# Patient Record
Sex: Female | Born: 1975 | Race: White | Hispanic: No | Marital: Married | Smoking: Never smoker
Health system: Southern US, Community
[De-identification: ages and names within clinical notes are randomized; demographics above are authoritative.]

## PROBLEM LIST (undated history)

## (undated) DIAGNOSIS — G43909 Migraine, unspecified, not intractable, without status migrainosus: Secondary | ICD-10-CM

## (undated) DIAGNOSIS — F32A Depression, unspecified: Secondary | ICD-10-CM

## (undated) DIAGNOSIS — M5137 Other intervertebral disc degeneration, lumbosacral region: Secondary | ICD-10-CM

## (undated) DIAGNOSIS — M1990 Unspecified osteoarthritis, unspecified site: Secondary | ICD-10-CM

## (undated) DIAGNOSIS — T7840XA Allergy, unspecified, initial encounter: Secondary | ICD-10-CM

## (undated) DIAGNOSIS — F419 Anxiety disorder, unspecified: Secondary | ICD-10-CM

## (undated) DIAGNOSIS — M51379 Other intervertebral disc degeneration, lumbosacral region without mention of lumbar back pain or lower extremity pain: Secondary | ICD-10-CM

## (undated) DIAGNOSIS — K219 Gastro-esophageal reflux disease without esophagitis: Secondary | ICD-10-CM

## (undated) DIAGNOSIS — M5136 Other intervertebral disc degeneration, lumbar region: Secondary | ICD-10-CM

## (undated) DIAGNOSIS — M5126 Other intervertebral disc displacement, lumbar region: Secondary | ICD-10-CM

## (undated) HISTORY — DX: Allergy, unspecified, initial encounter: T78.40XA

## (undated) HISTORY — DX: Other intervertebral disc degeneration, lumbar region: M51.36

## (undated) HISTORY — DX: Depression, unspecified: F32.A

## (undated) HISTORY — DX: Other intervertebral disc degeneration, lumbosacral region: M51.37

## (undated) HISTORY — DX: Migraine, unspecified, not intractable, without status migrainosus: G43.909

## (undated) HISTORY — DX: Other intervertebral disc displacement, lumbar region: M51.26

## (undated) HISTORY — DX: Anxiety disorder, unspecified: F41.9

## (undated) HISTORY — DX: Unspecified osteoarthritis, unspecified site: M19.90

## (undated) HISTORY — DX: Other intervertebral disc degeneration, lumbosacral region without mention of lumbar back pain or lower extremity pain: M51.379

## (undated) HISTORY — DX: Gastro-esophageal reflux disease without esophagitis: K21.9

---

## 2002-05-17 HISTORY — PX: AUGMENTATION MAMMAPLASTY: SUR837

## 2004-05-17 HISTORY — PX: TUBAL LIGATION: SHX77

## 2005-04-22 ENCOUNTER — Ambulatory Visit: Payer: Self-pay | Admitting: Unknown Physician Specialty

## 2011-07-28 ENCOUNTER — Ambulatory Visit: Payer: Self-pay

## 2012-12-07 ENCOUNTER — Ambulatory Visit: Payer: Self-pay | Admitting: Ophthalmology

## 2014-09-20 ENCOUNTER — Other Ambulatory Visit: Payer: Self-pay

## 2014-09-20 DIAGNOSIS — Z1231 Encounter for screening mammogram for malignant neoplasm of breast: Secondary | ICD-10-CM

## 2014-10-09 ENCOUNTER — Ambulatory Visit: Payer: Self-pay

## 2016-04-14 ENCOUNTER — Ambulatory Visit: Payer: BLUE CROSS/BLUE SHIELD | Admitting: Physical Therapy

## 2016-08-10 NOTE — Progress Notes (Signed)
HPI:      Lori Dawson is a 41 y.o. No obstetric history on file. who LMP was Patient's last menstrual period was 07/26/2016 (exact date)., presents today for her annual examination. Her menses are regular. They occur every 5 wks with cont dosing of OCPs, they last 7-10 days now , are medium flow, and are with quarter to half dollar sized clots. She reports dysmenorrhea. She uses nsaids without symptomatic relief. She started OCPs for menorrhagia with sx relief until this yr. Her menses are heavier now, with increased cramping. She can only go about 5 wks with cont dosing OCPs now before she starts spotting. She used to be able to go 3 months. She was on ocella but pharmacy changed to different generic the past 2 months with even more increased BTB.    Sex activity: single partner, contraception - tubal ligation.  Last Pap: August 07, 2014  Results were: no abnormalities /neg HPV DNA  Hx of STDs: none  Last mammogram: August 08, 2015  Results were: normal--routine follow-up in 12 months There is no FH of breast cancer. There is no FH of ovarian cancer. The patient does do self-breast exams.  Tobacco use: The patient denies current or previous tobacco use. Alcohol use: social drinker Exercise: moderately active  She does get adequate calcium and Vitamin D in her diet.   History reviewed. No pertinent past medical history.  Past Surgical History:  Procedure Laterality Date  . AUGMENTATION MAMMAPLASTY  2004  . TUBAL LIGATION  2006    Family History  Problem Relation Age of Onset  . Cervical cancer Maternal Aunt   . Diabetes Paternal Grandmother   . Lung cancer Paternal Grandmother   . Lung cancer Paternal Grandfather      ROS:  Review of Systems  Constitutional: Negative for fever, malaise/fatigue and weight loss.  HENT: Negative for congestion, ear pain and sinus pain.   Respiratory: Negative for cough, shortness of breath and wheezing.   Cardiovascular: Negative for  chest pain, orthopnea and leg swelling.  Gastrointestinal: Negative for constipation, diarrhea, nausea and vomiting.  Genitourinary: Negative for dysuria, frequency, hematuria and urgency.       Breast ROS: negative   Musculoskeletal: Negative for back pain, joint pain and myalgias.  Skin: Negative for itching and rash.  Neurological: Negative for dizziness, tingling, focal weakness and headaches.  Endo/Heme/Allergies: Negative for environmental allergies. Does not bruise/bleed easily.  Psychiatric/Behavioral: Negative for depression and suicidal ideas. The patient is not nervous/anxious and does not have insomnia.     Objective: BP 116/78 (BP Location: Left Arm, Patient Position: Sitting, Cuff Size: Normal)   Pulse 80   Ht 5' 7"  (1.702 m)   Wt 169 lb (76.7 kg)   LMP 07/26/2016 (Exact Date)   BMI 26.47 kg/m    Physical Exam  Constitutional: She is oriented to person, place, and time. She appears well-developed and well-nourished.  Genitourinary: Vagina normal and uterus normal. No erythema or tenderness in the vagina. No vaginal discharge found. Right adnexum does not display mass and does not display tenderness. Left adnexum does not display mass and does not display tenderness. Cervix does not exhibit motion tenderness or polyp. Uterus is not enlarged or tender.  Neck: Normal range of motion. No thyromegaly present.  Cardiovascular: Normal rate, regular rhythm and normal heart sounds.   No murmur heard. Pulmonary/Chest: Effort normal and breath sounds normal. Right breast exhibits no mass, no nipple discharge, no skin change and no  tenderness. Left breast exhibits no mass, no nipple discharge, no skin change and no tenderness.  Abdominal: Soft. There is no tenderness. There is no guarding.  Musculoskeletal: Normal range of motion.  Neurological: She is alert and oriented to person, place, and time. No cranial nerve deficit.  Psychiatric: She has a normal mood and affect. Her  behavior is normal.  Vitals reviewed.   Results: GYN u/s-->EM=7.43 mm; NO FF IN CDS; RTO WNL; LTO NOT SEEN DUE TO BOWEL  Assessment/Plan: Encounter for annual routine gynecological examination  Screening for breast cancer - Pt to sched mammo - Plan: MM DIGITAL SCREENING BILATERAL  Menorrhagia with regular cycle - Neg u/s today. Check labs. Will f/u with results. If neg, will change OCPs. - Plan: TSH, Prolactin, US Transvaginal Non-OB  Encounter for surveillance of contraceptive pills              GYN counsel mammography screening, use and side effects of OCP's, adequate intake of calcium and vitamin D     F/U  Return in about 1 year (around 08/11/2017).  Alicia B. Copland, PA-C 08/11/2016 12:06 PM

## 2016-08-11 ENCOUNTER — Ambulatory Visit (INDEPENDENT_AMBULATORY_CARE_PROVIDER_SITE_OTHER): Payer: BLUE CROSS/BLUE SHIELD | Admitting: Obstetrics and Gynecology

## 2016-08-11 ENCOUNTER — Other Ambulatory Visit (INDEPENDENT_AMBULATORY_CARE_PROVIDER_SITE_OTHER): Payer: BLUE CROSS/BLUE SHIELD

## 2016-08-11 ENCOUNTER — Encounter: Payer: Self-pay | Admitting: Obstetrics and Gynecology

## 2016-08-11 VITALS — BP 116/78 | HR 80 | Ht 67.0 in | Wt 169.0 lb

## 2016-08-11 DIAGNOSIS — Z3041 Encounter for surveillance of contraceptive pills: Secondary | ICD-10-CM | POA: Diagnosis not present

## 2016-08-11 DIAGNOSIS — Z1231 Encounter for screening mammogram for malignant neoplasm of breast: Secondary | ICD-10-CM | POA: Diagnosis not present

## 2016-08-11 DIAGNOSIS — N920 Excessive and frequent menstruation with regular cycle: Secondary | ICD-10-CM | POA: Diagnosis not present

## 2016-08-11 DIAGNOSIS — Z01419 Encounter for gynecological examination (general) (routine) without abnormal findings: Secondary | ICD-10-CM

## 2016-08-11 DIAGNOSIS — Z1239 Encounter for other screening for malignant neoplasm of breast: Secondary | ICD-10-CM

## 2016-08-12 ENCOUNTER — Telehealth: Payer: Self-pay | Admitting: Obstetrics and Gynecology

## 2016-08-12 LAB — TSH: TSH: 1.15 u[IU]/mL (ref 0.450–4.500)

## 2016-08-12 LAB — PROLACTIN: Prolactin: 9.2 ng/mL (ref 4.8–23.3)

## 2016-08-12 MED ORDER — NORETHIN-ETH ESTRAD-FE BIPHAS 1 MG-10 MCG / 10 MCG PO TABS: 1.0000 | ORAL_TABLET | Freq: Every day | ORAL | 3 refills | Status: DC

## 2016-08-12 NOTE — Telephone Encounter (Signed)
Pt aware of neg labs. Will change OCPs for menorrhagia to Lo Loestrin. Rx sent. Coupon card info given to pt via phone. If doesn't work, pt to notify office so we can send her card info. Pt to continue continuous dosing of OCPs. F/u prn.

## 2016-11-01 ENCOUNTER — Ambulatory Visit
Admission: RE | Admit: 2016-11-01 | Discharge: 2016-11-01 | Disposition: A | Discharge status: Home / Self Care | Payer: BLUE CROSS/BLUE SHIELD | Source: Ambulatory Visit | Attending: Obstetrics and Gynecology | Admitting: Obstetrics and Gynecology

## 2016-11-01 ENCOUNTER — Other Ambulatory Visit: Payer: Self-pay | Admitting: Obstetrics and Gynecology

## 2016-11-01 DIAGNOSIS — Z1239 Encounter for other screening for malignant neoplasm of breast: Secondary | ICD-10-CM

## 2016-11-01 DIAGNOSIS — R928 Other abnormal and inconclusive findings on diagnostic imaging of breast: Secondary | ICD-10-CM

## 2016-11-01 DIAGNOSIS — N6489 Other specified disorders of breast: Secondary | ICD-10-CM

## 2016-11-01 DIAGNOSIS — Z1231 Encounter for screening mammogram for malignant neoplasm of breast: Secondary | ICD-10-CM | POA: Diagnosis not present

## 2016-11-05 ENCOUNTER — Inpatient Hospital Stay
Admission: RE | Admit: 2016-11-05 | Discharge: 2016-11-05 | Disposition: A | Discharge status: Home / Self Care | Payer: Self-pay | Source: Ambulatory Visit | Attending: *Deleted | Admitting: *Deleted

## 2016-11-05 ENCOUNTER — Other Ambulatory Visit: Payer: Self-pay | Admitting: *Deleted

## 2016-11-05 DIAGNOSIS — Z9289 Personal history of other medical treatment: Secondary | ICD-10-CM

## 2016-11-10 ENCOUNTER — Ambulatory Visit
Admission: RE | Admit: 2016-11-10 | Discharge: 2016-11-10 | Disposition: A | Discharge status: Home / Self Care | Payer: BLUE CROSS/BLUE SHIELD | Source: Ambulatory Visit | Attending: Obstetrics and Gynecology | Admitting: Obstetrics and Gynecology

## 2016-11-10 ENCOUNTER — Telehealth: Payer: Self-pay | Admitting: Obstetrics and Gynecology

## 2016-11-10 ENCOUNTER — Other Ambulatory Visit: Payer: Self-pay | Admitting: Obstetrics and Gynecology

## 2016-11-10 DIAGNOSIS — R928 Other abnormal and inconclusive findings on diagnostic imaging of breast: Secondary | ICD-10-CM

## 2016-11-10 DIAGNOSIS — N6489 Other specified disorders of breast: Secondary | ICD-10-CM | POA: Diagnosis not present

## 2016-11-10 DIAGNOSIS — N6324 Unspecified lump in the left breast, lower inner quadrant: Secondary | ICD-10-CM | POA: Diagnosis not present

## 2016-11-10 HISTORY — DX: Other abnormal and inconclusive findings on diagnostic imaging of breast: R92.8

## 2016-11-10 NOTE — Telephone Encounter (Signed)
Pt aware of birads-3 mammo LT breast. Pt ok to wait 6 months for f/u. Will call back if wants gen surg eval for 2nd opinion or notices any breast changes. Mammo/u/s Order in system for 12/18.

## 2016-11-11 ENCOUNTER — Telehealth: Payer: Self-pay

## 2016-11-11 DIAGNOSIS — R928 Other abnormal and inconclusive findings on diagnostic imaging of breast: Secondary | ICD-10-CM

## 2016-11-11 NOTE — Telephone Encounter (Signed)
Order placed. Nancy to do ref and f/u with pt.

## 2016-11-11 NOTE — Telephone Encounter (Signed)
Pt would like ref to surgeon for 2nd opinion on abnl mammo.  She requests someone who has experience c implants.  [phone]

## 2016-11-12 ENCOUNTER — Encounter: Payer: Self-pay | Admitting: *Deleted

## 2016-11-16 ENCOUNTER — Encounter: Payer: Self-pay | Admitting: General Surgery

## 2016-11-16 ENCOUNTER — Inpatient Hospital Stay: Payer: Self-pay

## 2016-11-16 ENCOUNTER — Ambulatory Visit (INDEPENDENT_AMBULATORY_CARE_PROVIDER_SITE_OTHER): Payer: BLUE CROSS/BLUE SHIELD | Admitting: General Surgery

## 2016-11-16 VITALS — BP 124/82 | HR 70 | Resp 12 | Ht 67.0 in | Wt 165.0 lb

## 2016-11-16 DIAGNOSIS — N6323 Unspecified lump in the left breast, lower outer quadrant: Secondary | ICD-10-CM

## 2016-11-16 NOTE — Patient Instructions (Addendum)
  The patient is aware to call back for any questions, new concerns or wishes to have a left breast biopsy.

## 2016-11-16 NOTE — Progress Notes (Signed)
Patient ID: Lori Dawson, female   DOB: [birth year], 41 y.o.   MRN: [id number]  Chief Complaint  Patient presents with  . Breast Problem    HPI Lori Dawson is a 41 y.o. female.  who presents for a breast evaluation. Referred by Ardeth Perfect PA. The most recent mammogram and left ultrasound was done on 11-10-16. She states the left breast has a random soreness feeling but no mass. Denies any breast injury or trauma. She does admit to mastitis back in 1999 but does not remember which breast. The patient reports that prior to her augmentation the left breast was a small B based bra cup, the left breast a C cup with marked distortion in the contour of the breast. These asymmetries were resolved with augmentation.  Patient does perform regular self breast checks and gets regular mammograms done.  No family history of breast cancer.   HPI  Past Medical History:  Diagnosis Date  . Allergy    seasonal    Past Surgical History:  Procedure Laterality Date  . AUGMENTATION MAMMAPLASTY  2004   in [place], right breast was larger  . TUBAL LIGATION  2006    Family History  Problem Relation Age of Onset  . Cervical cancer Maternal Aunt   . Diabetes Paternal Grandmother   . Lung cancer Paternal Grandmother   . Lung cancer Paternal Grandfather   . Breast cancer Neg Hx     Social History Social History  Substance Use Topics  . Smoking status: Never Smoker  . Smokeless tobacco: Never Used  . Alcohol use 0.6 oz/week    1 Glasses of wine per week    Allergies  Allergen Reactions  . Levofloxacin Nausea And Vomiting    Other reaction(s): Dizziness    Current Outpatient Prescriptions  Medication Sig Dispense Refill  . cetirizine (ZYRTEC) 10 MG tablet Take 10 mg by mouth daily.    Marland Kitchen ibuprofen (ADVIL,MOTRIN) 400 MG tablet Take 400 mg by mouth as needed.    . Norethindrone-Ethinyl Estradiol-Fe Biphas (LO LOESTRIN FE) 1 MG-10 MCG / 10 MCG tablet Take 1 tablet by mouth daily. 84 tablet 3    No current facility-administered medications for this visit.     Review of Systems Review of Systems  Constitutional: Negative.   Respiratory: Negative.   Cardiovascular: Negative.     Blood pressure 124/82, pulse 70, resp. rate 12, height 5' 7"  (1.702 m), weight 165 lb (74.8 kg), last menstrual period 10/18/2016.  Physical Exam Physical Exam  Constitutional: She is oriented to person, place, and time. She appears well-developed and well-nourished.  HENT:  Mouth/Throat: Oropharynx is clear and moist.  Eyes: Conjunctivae are normal. No scleral icterus.  Neck: Neck supple.  Cardiovascular: Normal rate, regular rhythm and normal heart sounds.   Pulmonary/Chest: Effort normal and breath sounds normal. Right breast exhibits no inverted nipple, no mass, no nipple discharge, no skin change and no tenderness. Left breast exhibits no inverted nipple, no mass, no nipple discharge, no skin change and no tenderness.  Lymphadenopathy:    She has no cervical adenopathy.    She has no axillary adenopathy.  Neurological: She is alert and oriented to person, place, and time.  Skin: Skin is warm and dry.  Psychiatric: Her behavior is normal.    Data Reviewed March 2013 through 11/10/2016 mammograms and ultrasounds were reviewed. Most recent study described a probably benign abnormality in the lower inner quadrant of left breast, complicated cyst with associated prominent duct. Short-term follow-up recommended.  BI-RADS-3.  On review of the 2017-2018 mammograms the focal abnormality that generated the request for ultrasound appears if anything slightly less prominent on the 2018 films.  With the findings of the ultrasound, which likely accounted for the mammographic abnormality has raised a question of a possible mass versus cyst. The patient reports questioning the radiologist if a biopsy would be completed if she did not have implants and she implied that he somewhat hesitated raising a concern  that a procedure was being deferred for fear of injury to the implants. This was the patient's interpretation of events.   On review of the films there is certainly reasonable spacing between the ultrasound abnormality and the underlying pectoralis muscle and I assured the patient that had the radiologist felt strongly about a biopsy today would've recommended the procedure be completed.  She expressed a potential desire for excision without 6 months follow-up and for that reason the area was ultrasound to determine if it would be amenable to an office procedure.  In the left breast at the 8:00 position, 8 (rather than 6) centimeters from the nipple an irregular slightly hyperechoic with hypoechoic feature lesion measuring 0.5 x 0.5 x 1.1 cm is identified and appears to correlate with the imaging studies from the [facility]. This areas situated above the pectoralis muscle and is amenable to vacuum biopsy. This is indeterminate lesion, BIRAD-3.   Assessment    Indeterminate lesion on recent mammogram/ultrasound.    Plan    We reviewed the pros and cons of early versus late biopsy. There is a component of "who will raise my babies" even though her daughters are 37 and 8. We reviewed that biopsy can resolve the ambiguity at low risk if she desires.  She was encouraged to review her options with her family and notify the office if she would like to proceed to biopsy. Otherwise, the radiologist recommendation for a follow-up in December 2018 is very appropriate.     HPI, Physical Exam, Assessment and Plan have been scribed under the direction and in the presence of Robert Bellow, MD. Karie Fetch, RN  I have completed the exam and reviewed the above documentation for accuracy and completeness.  I agree with the above.  Haematologist has been used and any errors in dictation or transcription are unintentional.  Hervey Ard, M.D., F.A.C.S.  Robert Bellow 11/17/2016, 8:19  AM

## 2016-11-17 DIAGNOSIS — N6324 Unspecified lump in the left breast, lower inner quadrant: Secondary | ICD-10-CM | POA: Insufficient documentation

## 2016-11-17 HISTORY — DX: Unspecified lump in the left breast, lower inner quadrant: N63.24

## 2016-11-18 HISTORY — PX: BREAST BIOPSY: SHX20

## 2016-11-24 ENCOUNTER — Inpatient Hospital Stay: Payer: Self-pay

## 2016-11-24 ENCOUNTER — Encounter: Payer: Self-pay | Admitting: General Surgery

## 2016-11-24 ENCOUNTER — Ambulatory Visit (INDEPENDENT_AMBULATORY_CARE_PROVIDER_SITE_OTHER): Payer: BLUE CROSS/BLUE SHIELD | Admitting: General Surgery

## 2016-11-24 VITALS — BP 118/80 | HR 76 | Resp 12 | Ht 67.0 in | Wt 165.0 lb

## 2016-11-24 DIAGNOSIS — N6324 Unspecified lump in the left breast, lower inner quadrant: Secondary | ICD-10-CM | POA: Diagnosis not present

## 2016-11-24 DIAGNOSIS — N6320 Unspecified lump in the left breast, unspecified quadrant: Secondary | ICD-10-CM

## 2016-11-24 NOTE — Patient Instructions (Signed)
CARE AFTER BREAST BIOPSY  1. Leave the dressing on that your doctor applied after the biopsy. It is waterproof. You may bathe, shower and/or swim. The dressing can be removed in 3 days, you will see small strips of tape against your skin on the incision. Do not remove these strips they will gradually fall off in about 2-3 weeks. You may use an ice pack on and off for the first 12-24 hours for comfort.  2. You may want to use a gauze,cloth or similar protection in your bra to prevent rubbing against your dressing and incision. This is not necessary, but you may feel more comfortable doing so.  3. It is recommended that you wear a bra day and night to give support to the breast. This will prevent the weight of the breast from pulling on the incision.  4. Your breast may feel hard and lumpy under the incision. Do not be alarmed. This is the underlying stitching of tissue. Softening of this tissue will occur in time.  5. You may have a follow up appointment or phone follow up in one week after your biopsy. The office phone number is [phone].  6. You will notice about a week or two after your office visit that the strips of the tape on your incision will begin to loosen. These may then be removed.  7. Report to your doctor any of the following:  * Severe pain not relieved by your pain medication  *Redness of the incision  * Drainage from the incision  *Fever greater than 101 degrees

## 2016-11-24 NOTE — Progress Notes (Signed)
Patient ID: Lori Dawson, female   DOB: [birth year], 41 y.o.   MRN: [id number]  Chief Complaint  Patient presents with  . Procedure    HPI Lori Dawson is a 41 y.o. female.  Here for a planned left breast biopsy.  HPI  Past Medical History:  Diagnosis Date  . Allergy    seasonal    Past Surgical History:  Procedure Laterality Date  . AUGMENTATION MAMMAPLASTY  2004   in [place], right breast was larger  . TUBAL LIGATION  2006    Family History  Problem Relation Age of Onset  . Cervical cancer Maternal Aunt   . Diabetes Paternal Grandmother   . Lung cancer Paternal Grandmother   . Lung cancer Paternal Grandfather   . Breast cancer Neg Hx     Social History Social History  Substance Use Topics  . Smoking status: Never Smoker  . Smokeless tobacco: Never Used  . Alcohol use 0.6 oz/week    1 Glasses of wine per week    Allergies  Allergen Reactions  . Levofloxacin Nausea And Vomiting    Other reaction(s): Dizziness    Current Outpatient Prescriptions  Medication Sig Dispense Refill  . cetirizine (ZYRTEC) 10 MG tablet Take 10 mg by mouth daily.    Marland Kitchen ibuprofen (ADVIL,MOTRIN) 400 MG tablet Take 400 mg by mouth as needed.    . Norethindrone-Ethinyl Estradiol-Fe Biphas (LO LOESTRIN FE) 1 MG-10 MCG / 10 MCG tablet Take 1 tablet by mouth daily. 84 tablet 3   No current facility-administered medications for this visit.     Review of Systems Review of Systems  Constitutional: Negative.   Respiratory: Negative.   Cardiovascular: Negative.     Blood pressure 118/80, pulse 76, resp. rate 12, height 5' 7"  (1.702 m), weight 165 lb (74.8 kg), last menstrual period 09/14/2016.  Physical Exam Physical Exam  Constitutional: She is oriented to person, place, and time. She appears well-developed and well-nourished.  Neurological: She is alert and oriented to person, place, and time.  Skin: Skin is warm and dry.  Psychiatric: Her behavior is normal.    Data Reviewed The  procedure was reviewed and the patient was amenable to proceed. Alcohol was applied to the skin followed by 10 mL of 0.5% Xylocaine with 0.25% Marcaine with 1-200,000 units of epinephrine. This was instilled under ultrasound guidance. The focal area of abnormality in the lower inner quadrant of the left breast at the 8:00 position was identified. This area was approached from a medial to lateral position after skin cleansing with ChloraPrep. Making use of a 10-gauge Encor device the area was removed with 5 core samples. Care was taken to not violate pectoralis fascia. A 10 mL hematoma develop which was aspirated and treated with an injection of 1% Xylocaine with 1-100,000 of epinephrine into the biopsy cavity. With direct pressure this did not recur. Post biopsy clip was placed. Skin defect closed with benzoin and Steri-Strips followed by Telfa and Tegaderm.  The patient tolerated the procedure well.  Assessment    Focal abnormality of the left breast, resolved on biopsy.    Plan    The patient will be contacted when pathology is available. We'll arrange for postbiopsy mammography in the near future.    HPI, Physical Exam, Assessment and Plan have been scribed under the direction and in the presence of Robert Bellow, MD. Karie Fetch, RN  I have completed the exam and reviewed the above documentation for accuracy and completeness.  I agree  with the above.  Haematologist has been used and any errors in dictation or transcription are unintentional.  Hervey Ard, M.D., F.A.C.S.  Robert Bellow 11/24/2016, 10:01 PM

## 2016-11-25 ENCOUNTER — Telehealth: Payer: Self-pay | Admitting: *Deleted

## 2016-11-25 ENCOUNTER — Other Ambulatory Visit: Payer: Self-pay | Admitting: *Deleted

## 2016-11-25 DIAGNOSIS — N6323 Unspecified lump in the left breast, lower outer quadrant: Secondary | ICD-10-CM

## 2016-11-25 NOTE — Telephone Encounter (Signed)
Patient called the office back and was notified as instructed. She verbalizes understanding.

## 2016-11-25 NOTE — Telephone Encounter (Signed)
Message left for patient to call the office.   Negative pathology report from 11-24-16.   Dr. Bary Castilla wants patient to have a 2 view post biopsy mammogram. This has been arranged for 12-02-16 at 9:40 am.

## 2016-11-30 ENCOUNTER — Ambulatory Visit (INDEPENDENT_AMBULATORY_CARE_PROVIDER_SITE_OTHER): Payer: BLUE CROSS/BLUE SHIELD | Admitting: *Deleted

## 2016-11-30 DIAGNOSIS — N6324 Unspecified lump in the left breast, lower inner quadrant: Secondary | ICD-10-CM

## 2016-11-30 NOTE — Progress Notes (Signed)
Patient ID: Lori Dawson, female   DOB: 07-13-1975, 41 y.o.   MRN: [id number]  Patient came in today for a wound check post left breast biopsy.  The wound is clean, with no signs of infection noted. Mild to moderate localized bruising noted. No underlying nodule/knot noted. She is still tender and wishes to change her post biopsy mammogram to after her cruise. Aware to use heating pad for comfort and wear a bra as needed for comfort. Mammogram rescheduled for 12-20-16.  Follow up pending.

## 2016-11-30 NOTE — Patient Instructions (Signed)
The patient is aware to call back for any questions or concerns.  

## 2016-12-22 ENCOUNTER — Ambulatory Visit
Admission: RE | Admit: 2016-12-22 | Discharge: 2016-12-22 | Disposition: A | Discharge status: Home / Self Care | Payer: BLUE CROSS/BLUE SHIELD | Source: Ambulatory Visit | Attending: General Surgery | Admitting: General Surgery

## 2016-12-22 DIAGNOSIS — N6323 Unspecified lump in the left breast, lower outer quadrant: Secondary | ICD-10-CM | POA: Insufficient documentation

## 2016-12-30 ENCOUNTER — Telehealth: Payer: Self-pay

## 2016-12-30 NOTE — Telephone Encounter (Signed)
-----   Message from Robert Bellow, MD sent at 12/30/2016  7:12 AM EDT ----- Please notify the patient that her postbiopsy mammogram showed the area of concern has been removed. She should resume annual screening mammograms. Find out if she's had any problems postbiopsy. Thank you

## 2016-12-30 NOTE — Telephone Encounter (Signed)
Notified patient as instructed, patient pleased °

## 2017-02-02 ENCOUNTER — Encounter: Payer: Self-pay | Admitting: Obstetrics and Gynecology

## 2017-02-03 ENCOUNTER — Other Ambulatory Visit: Payer: Self-pay | Admitting: Obstetrics and Gynecology

## 2017-02-03 MED ORDER — DROSPIRENONE-ETHINYL ESTRADIOL 3-0.03 MG PO TABS: 1.0000 | ORAL_TABLET | Freq: Every day | ORAL | 6 refills | Status: DC

## 2017-02-03 NOTE — Progress Notes (Signed)
Rx change due to BTB with Lo Loestrin.

## 2017-05-04 ENCOUNTER — Ambulatory Visit
Admission: RE | Admit: 2017-05-04 | Discharge: 2017-05-04 | Disposition: A | Discharge status: Home / Self Care | Payer: BLUE CROSS/BLUE SHIELD | Source: Ambulatory Visit | Attending: Obstetrics and Gynecology | Admitting: Obstetrics and Gynecology

## 2017-05-04 ENCOUNTER — Other Ambulatory Visit: Payer: Self-pay | Admitting: Obstetrics and Gynecology

## 2017-05-04 DIAGNOSIS — R928 Other abnormal and inconclusive findings on diagnostic imaging of breast: Secondary | ICD-10-CM | POA: Diagnosis present

## 2017-07-01 ENCOUNTER — Other Ambulatory Visit: Payer: Self-pay | Admitting: Obstetrics and Gynecology

## 2017-08-14 ENCOUNTER — Other Ambulatory Visit: Payer: Self-pay

## 2017-08-14 ENCOUNTER — Emergency Department: Payer: BLUE CROSS/BLUE SHIELD

## 2017-08-14 ENCOUNTER — Emergency Department
Admission: EM | Admit: 2017-08-14 | Discharge: 2017-08-14 | Disposition: A | Discharge status: Home / Self Care | Payer: BLUE CROSS/BLUE SHIELD | Attending: Emergency Medicine | Admitting: Emergency Medicine

## 2017-08-14 ENCOUNTER — Encounter: Payer: Self-pay | Admitting: Emergency Medicine

## 2017-08-14 DIAGNOSIS — Z23 Encounter for immunization: Secondary | ICD-10-CM | POA: Diagnosis not present

## 2017-08-14 DIAGNOSIS — S61217A Laceration without foreign body of left little finger without damage to nail, initial encounter: Secondary | ICD-10-CM | POA: Diagnosis not present

## 2017-08-14 DIAGNOSIS — S60512A Abrasion of left hand, initial encounter: Secondary | ICD-10-CM | POA: Diagnosis not present

## 2017-08-14 DIAGNOSIS — Y929 Unspecified place or not applicable: Secondary | ICD-10-CM | POA: Diagnosis not present

## 2017-08-14 DIAGNOSIS — Y939 Activity, unspecified: Secondary | ICD-10-CM | POA: Insufficient documentation

## 2017-08-14 DIAGNOSIS — Y999 Unspecified external cause status: Secondary | ICD-10-CM | POA: Insufficient documentation

## 2017-08-14 DIAGNOSIS — S6992XA Unspecified injury of left wrist, hand and finger(s), initial encounter: Secondary | ICD-10-CM | POA: Diagnosis present

## 2017-08-14 DIAGNOSIS — W540XXA Bitten by dog, initial encounter: Secondary | ICD-10-CM | POA: Insufficient documentation

## 2017-08-14 MED ORDER — AMOXICILLIN-POT CLAVULANATE 875-125 MG PO TABS: 1.0000 | ORAL_TABLET | Freq: Two times a day (BID) | ORAL | 0 refills | Status: AC

## 2017-08-14 MED ORDER — AMOXICILLIN-POT CLAVULANATE 875-125 MG PO TABS
1.0000 | ORAL_TABLET | Freq: Once | ORAL | Status: AC
  Administered 2017-08-14: 1 via ORAL
  Filled 2017-08-14: qty 1

## 2017-08-14 MED ORDER — LIDOCAINE-EPINEPHRINE-TETRACAINE (LET) SOLUTION
3.0000 mL | Freq: Once | NASAL | Status: AC
  Administered 2017-08-14: 3 mL via TOPICAL

## 2017-08-14 MED ORDER — FLUCONAZOLE 200 MG PO TABS: 200.0000 mg | ORAL_TABLET | Freq: Once | ORAL | 0 refills | Status: AC

## 2017-08-14 MED ORDER — ACETAMINOPHEN 500 MG PO TABS
1000.0000 mg | ORAL_TABLET | Freq: Once | ORAL | Status: AC
  Administered 2017-08-14: 1000 mg via ORAL
  Filled 2017-08-14: qty 2

## 2017-08-14 MED ORDER — LIDOCAINE-EPINEPHRINE-TETRACAINE (LET) SOLUTION
NASAL | Status: AC
  Administered 2017-08-14: 3 mL via TOPICAL
  Filled 2017-08-14: qty 3

## 2017-08-14 MED ORDER — TETANUS-DIPHTH-ACELL PERTUSSIS 5-2.5-18.5 LF-MCG/0.5 IM SUSP
0.5000 mL | Freq: Once | INTRAMUSCULAR | Status: AC
  Administered 2017-08-14: 0.5 mL via INTRAMUSCULAR
  Filled 2017-08-14: qty 0.5

## 2017-08-14 NOTE — ED Triage Notes (Signed)
Pt was brought from [facility] acute care for further exam of left hand, pt's dog bit her hand as it was having a seizure. kcac concerned that there maybe tendon involvement, hand cleaned and wrapped

## 2017-08-14 NOTE — ED Triage Notes (Signed)
Pt to ED from [facility] walk in. Pt was bitten by her dog on the left hand. Hand currently bandaged. Bleeding is controlled at this time. Dog was having a seizure and she was trying to comfort the dog and when he woke up, he bit her. Dog is up to date on all vaccines. [facility] reported the bite.

## 2017-08-14 NOTE — ED Notes (Signed)
See triage note  Presents s/p dog bite to left hand  Has 2 puncture type wounds to left hand near 4th and 5th fingers

## 2017-08-14 NOTE — ED Provider Notes (Signed)
[facility] DEPART[facility] Provider Note   CSN: [id number] Arrival date & time: 08/14/17  1524     History   Chief Complaint Chief Complaint  Patient presents with  . Animal Bite    HPI Lori Dawson is a 42 y.o. female.  Presents to the emergency department for evaluation of animal bite to the left hand.  Patient states her dog was having a seizure and she was trying to comfort the dog when the dog bit her left hand.  Patient's tetanus is not up-to-date.  Patient states her dog's vaccinations are up-to-date.  Patient suffered abrasions lacerations to the left hand.  Her pain is mild 3 out of 10.  She has not had medications for pain.  She was seen at the walk-in clinic and sent over for further evaluation.  She denies any other injury to her body.  HPI  Past Medical History:  Diagnosis Date  . Allergy    seasonal    Patient Active Problem List   Diagnosis Date Noted  . Mass of lower inner quadrant of left breast 11/17/2016  . Abnormal mammogram 11/10/2016    Past Surgical History:  Procedure Laterality Date  . AUGMENTATION MAMMAPLASTY  2004   in [place], right breast was larger  . BREAST BIOPSY Left 11/18/2016   core bx Byrnett  . TUBAL LIGATION  2006     OB History    Gravida  2   Para      Term      Preterm      AB      Living        SAB      TAB      Ectopic      Multiple      Live Births  2        Obstetric Comments  1st Menstrual Cycle:  10 1st Pregnancy:  21          Home Medications    Prior to Admission medications   Medication Sig Start Date End Date Taking? Authorizing Provider  cetirizine (ZYRTEC) 10 MG tablet Take 10 mg by mouth daily.    [provider]  drospirenone-ethinyl estradiol (YASMIN,ZARAH,SYEDA) 3-0.03 MG tablet TAKE 1 TABLET BY MOUTH DAILY. CONTINUOUS DOSING 5/99/35   Copland, Alicia B, PA-C  ibuprofen (ADVIL,MOTRIN) 400 MG tablet Take 400 mg by mouth as needed.    [provider]    Family History Family History  Problem Relation Age of Onset  . Cervical cancer Maternal Aunt   . Diabetes Paternal Grandmother   . Lung cancer Paternal Grandmother   . Lung cancer Paternal Grandfather   . Breast cancer Neg Hx     Social History Social History   Tobacco Use  . Smoking status: Never Smoker  . Smokeless tobacco: Never Used  Substance Use Topics  . Alcohol use: Yes    Alcohol/week: 0.6 oz    Types: 1 Glasses of wine per week  . Drug use: No     Allergies   Levofloxacin   Review of Systems Review of Systems  Constitutional: Negative for fever.  Musculoskeletal: Positive for arthralgias. Negative for joint swelling, myalgias and neck pain.  Skin: Positive for wound. Negative for rash.  Neurological: Negative for numbness.     Physical Exam Updated Vital Signs BP 129/87 (BP Location: Right Arm)   Pulse 84   Temp 99.6 F (37.6 C) (Oral)   Resp 18   Ht 5'  7" (1.702 m)   Wt 72.6 kg (160 lb)   LMP 06/18/2017   SpO2 99%   BMI 25.06 kg/m   Physical Exam  Constitutional: She is oriented to person, place, and time. She appears well-developed and well-nourished.  HENT:  Head: Normocephalic and atraumatic.  Eyes: Conjunctivae are normal.  Cardiovascular: Normal rate.  Pulmonary/Chest: Effort normal. No respiratory distress.  Musculoskeletal: Normal range of motion. She exhibits tenderness.  Examination of the left hand shows patient has small abrasion on the dorsal aspect of the MCP, fourth digit.  No laceration or puncture wound noted.  Patient has full active extension and flexion of all 5 digits.  There is a small laceration on the base of the volar aspect of the fifth digit at the base of the proximal phalanx extending into the web space of the fourth and fifth digits of the left hand.  There is no visible palpable foreign body.  No active bleeding.  Sensation is intact distally.  No sign of a nail injury.  Neurological: She is  alert and oriented to person, place, and time.  Skin: No rash noted. No erythema.  Psychiatric: She has a normal mood and affect. Judgment and thought content normal.     ED Treatments / Results  Labs (all labs ordered are listed, but only abnormal results are displayed) Labs Reviewed - No data to display  EKG None  Radiology No results found.  Procedures .Marland KitchenLaceration Repair Date/Time: 08/14/2017 4:33 PM Performed by: Duanne Guess, PA-C Authorized by: Duanne Guess, PA-C   Consent:    Consent obtained:  Verbal   Consent given by:  Patient   Risks discussed:  Infection, pain, retained foreign body and nerve damage   Alternatives discussed:  No treatment Anesthesia (see MAR for exact dosages):    Anesthesia method:  Topical application   Topical anesthetic:  LET Laceration details:    Location:  Finger   Finger location:  L small finger   Length (cm):  1   Depth (mm):  2 Repair type:    Repair type:  Simple Pre-procedure details:    Preparation:  Patient was prepped and draped in usual sterile fashion Exploration:    Wound exploration: wound explored through full range of motion and entire depth of wound probed and visualized   Treatment:    Area cleansed with:  Betadine and saline   Irrigation method:  Pressure wash and syringe Skin repair:    Repair method:  Sutures   Suture size:  5-0   Suture technique:  Simple interrupted   Number of sutures:  2 Approximation:    Approximation:  Close Post-procedure details:    Dressing:  Open (no dressing)   Patient tolerance of procedure:  Tolerated well, no immediate complications   (including critical care time)  Medications Ordered in ED Medications  Tdap (BOOSTRIX) injection 0.5 mL (0.5 mLs Intramuscular Given 08/14/17 1618)  acetaminophen (TYLENOL) tablet 1,000 mg (1,000 mg Oral Given 08/14/17 1618)  lidocaine-EPINEPHrine-tetracaine (LET) solution (3 mLs Topical Given 08/14/17 1618)  amoxicillin-clavulanate  (AUGMENTIN) 875-125 MG per tablet 1 tablet (1 tablet Oral Given 08/14/17 1625)     Initial Impression / Assessment and Plan / ED Course  I have reviewed the triage vital signs and the nursing notes.  Pertinent labs & imaging results that were available during my care of the patient were reviewed by me and considered in my medical decision making (see chart for details).     42 year old female  with dog bite to the left hand.  She suffered a small abrasion on the dorsal aspect of the hand as well as a puncture wound of the base of the left fifth digit.  Patient has full active flexion and extension of the fourth and fifth digits.  No tendon deficits noted.  Slight sensation discrepancy along the radial aspect of the fifth digit.  Laceration site repaired with #2 5-0 nylon sutures.  Compressive dressing applied.  Patient is placed on prophylactic antibiotic.  Tetanus is updated today in the ED.  X-rays ordered and reviewed by me today show no evidence of foreign body or acute bony normality.  She will follow-up in 7-10 days for suture removal.  Final Clinical Impressions(s) / ED Diagnoses   Final diagnoses:  Dog bite, initial encounter  Laceration of left little finger without foreign body without damage to nail, initial encounter  Abrasion of left hand, initial encounter    ED Discharge Orders    None       Renata Caprice 08/14/17 1725    Hinda Kehr, MD 08/17/17 1221

## 2017-08-14 NOTE — Discharge Instructions (Signed)
Please continue with compressive dressing for 24 hours.  Please alternate Tylenol 1000 mg with ibuprofen 800 mg every 6 hours as needed for pain.  Try to keep the left hand elevated as much as he can over the next 2 days to help with swelling.  After 24 hours, you may remove compressive dressing, begin to shower.  Do not submerge hand under water.  You may keep laceration, abrasions covered with Band-Aid during the day.  Take antibiotics as prescribed.  If any increasing pain swelling or redness he may return to the walk-in clinic for emergency department.  Please follow-up with walk-in clinic in 7-10 days for suture removal.

## 2017-08-15 ENCOUNTER — Ambulatory Visit (INDEPENDENT_AMBULATORY_CARE_PROVIDER_SITE_OTHER): Payer: BLUE CROSS/BLUE SHIELD | Admitting: Obstetrics and Gynecology

## 2017-08-15 ENCOUNTER — Encounter: Payer: Self-pay | Admitting: Obstetrics and Gynecology

## 2017-08-15 VITALS — BP 134/92 | HR 75 | Ht 67.0 in | Wt 161.0 lb

## 2017-08-15 DIAGNOSIS — Z1151 Encounter for screening for human papillomavirus (HPV): Secondary | ICD-10-CM | POA: Diagnosis not present

## 2017-08-15 DIAGNOSIS — Z8249 Family history of ischemic heart disease and other diseases of the circulatory system: Secondary | ICD-10-CM

## 2017-08-15 DIAGNOSIS — R030 Elevated blood-pressure reading, without diagnosis of hypertension: Secondary | ICD-10-CM

## 2017-08-15 DIAGNOSIS — Z01419 Encounter for gynecological examination (general) (routine) without abnormal findings: Secondary | ICD-10-CM

## 2017-08-15 DIAGNOSIS — Z0000 Encounter for general adult medical examination without abnormal findings: Secondary | ICD-10-CM | POA: Diagnosis not present

## 2017-08-15 DIAGNOSIS — Z13220 Encounter for screening for lipoid disorders: Secondary | ICD-10-CM | POA: Diagnosis not present

## 2017-08-15 DIAGNOSIS — Z1231 Encounter for screening mammogram for malignant neoplasm of breast: Secondary | ICD-10-CM | POA: Diagnosis not present

## 2017-08-15 DIAGNOSIS — Z124 Encounter for screening for malignant neoplasm of cervix: Secondary | ICD-10-CM

## 2017-08-15 DIAGNOSIS — Z1329 Encounter for screening for other suspected endocrine disorder: Secondary | ICD-10-CM | POA: Diagnosis not present

## 2017-08-15 DIAGNOSIS — Z1239 Encounter for other screening for malignant neoplasm of breast: Secondary | ICD-10-CM

## 2017-08-15 DIAGNOSIS — Z3041 Encounter for surveillance of contraceptive pills: Secondary | ICD-10-CM | POA: Diagnosis not present

## 2017-08-15 DIAGNOSIS — R079 Chest pain, unspecified: Secondary | ICD-10-CM

## 2017-08-15 DIAGNOSIS — N938 Other specified abnormal uterine and vaginal bleeding: Secondary | ICD-10-CM

## 2017-08-15 NOTE — Patient Instructions (Signed)
I value your feedback and entrusting us with your care. If you get a [facility] patient survey, I would appreciate you taking the time to let us know about your experience today. Thank you! 

## 2017-08-15 NOTE — Progress Notes (Signed)
HPI:      Lori Dawson is a 42 y.o. G2P0 who LMP was Patient's last menstrual period was 06/18/2017 (exact date)., presents today for her annual examination. Her menses are irregular.  Menses last about 7 days on placebo pills but then pt has bleeding for 3-7 days after sex. She has dysmen with bleeding episodes, usually improved with NSAIDs. She used to be able to have menses Q3 months with cont dosing of yasmin. Last yr she could only go 5 wks without bleeding, so OCPs changed to Lo Loestrin. Pt states bleeding more frequent so we switched back to Yasmin for Q5 wk periods. But, bleeding more frequent now and can't go a month without BTB. Pt has bled most of March. No recent labs.  Sex activity: single partner, contraception - tubal ligation.  Last Pap: August 07, 2014  Results were: no abnormalities /neg HPV DNA  Hx of STDs: none  Last mammogram: 11/10/16  Results were: Cat 3 LT breast. Pt had repeat mammo and u/s 12/18 that was cat 2. Due for yearly mammo 6/19. There is no FH of breast cancer. There is no FH of ovarian cancer. The patient does do self-breast exams.  Tobacco use: The patient denies current or previous tobacco use. Alcohol use: social drinker Exercise: moderately active  She does get adequate calcium and Vitamin D in her diet.  Her brother died suddenly from MI last wk at age 39 without any sx. Pt is concerned because had episode of hot flashes, nausea, near syncope and chest pain 12/18. No sx since. No recent lipid labs. Pt's BP elevated during cortisone inj in back recently, and again today, but pt just lost brother.   Past Medical History:  Diagnosis Date  . Allergy    seasonal  . 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 Herniated lumbar intervertebral disc     Past Surgical History:  Procedure Laterality Date  . AUGMENTATION MAMMAPLASTY  2004   in [place], right breast was larger  . BREAST BIOPSY Left 11/18/2016   core bx Byrnett  . TUBAL  LIGATION  2006    Family History  Problem Relation Age of Onset  . Cervical cancer Maternal Aunt   . Diabetes Paternal Grandmother   . Lung cancer Paternal Grandmother   . Lung cancer Paternal Grandfather   . Heart attack Brother 63  . Breast cancer Neg Hx     Social History   Socioeconomic History  . Marital status: Married    Spouse name: Not on file  . Number of children: Not on file  . Years of education: Not on file  . Highest education level: Not on file  Occupational History  . Not on file  Social Needs  . Financial resource strain: Not on file  . Food insecurity:    Worry: Not on file    Inability: Not on file  . Transportation needs:    Medical: Not on file    Non-medical: Not on file  Tobacco Use  . Smoking status: Never Smoker  . Smokeless tobacco: Never Used  Substance and Sexual Activity  . Alcohol use: Yes    Alcohol/week: 0.6 oz    Types: 1 Glasses of wine per week  . Drug use: No  . Sexual activity: Yes    Birth control/protection: Surgical    Comment: Tubal ligation   Lifestyle  . Physical activity:    Days per week: Not on file  Minutes per session: Not on file  . Stress: Not on file  Relationships  . Social connections:    Talks on phone: Not on file    Gets together: Not on file    Attends religious service: Not on file    Active member of club or organization: Not on file    Attends meetings of clubs or organizations: Not on file    Relationship status: Not on file  . Intimate partner violence:    Fear of current or ex partner: Not on file    Emotionally abused: Not on file    Physically abused: Not on file    Forced sexual activity: Not on file  Other Topics Concern  . Not on file  Social History Narrative  . Not on file    Current Outpatient Medications on File Prior to Visit  Medication Sig Dispense Refill  . amoxicillin-clavulanate (AUGMENTIN) 875-125 MG tablet Take 1 tablet by mouth every 12 (twelve) hours for 7 days. 14  tablet 0  . Azelastine-Fluticasone 137-50 MCG/ACT SUSP Place into the nose.    . cetirizine (ZYRTEC) 10 MG tablet Take 10 mg by mouth daily.    . cyclobenzaprine (FLEXERIL) 10 MG tablet Take 10 mg by mouth 3 (three) times daily as needed for muscle spasms.    . drospirenone-ethinyl estradiol (YASMIN,ZARAH,SYEDA) 3-0.03 MG tablet TAKE 1 TABLET BY MOUTH DAILY. CONTINUOUS DOSING 28 tablet 1  . fluconazole (DIFLUCAN) 200 MG tablet   0  . ibuprofen (ADVIL,MOTRIN) 400 MG tablet Take 400 mg by mouth as needed.    . montelukast (SINGULAIR) 10 MG tablet     . Olopatadine HCl 0.2 % SOLN      No current facility-administered medications on file prior to visit.       ROS:  Review of Systems  Constitutional: Negative for fatigue, fever and unexpected weight change.  Respiratory: Negative for cough, shortness of breath and wheezing.   Cardiovascular: Negative for chest pain, palpitations and leg swelling.  Gastrointestinal: Negative for blood in stool, constipation, diarrhea, nausea and vomiting.  Endocrine: Negative for cold intolerance, heat intolerance and polyuria.  Genitourinary: Positive for dyspareunia, menstrual problem and vaginal discharge. Negative for dysuria, flank pain, frequency, genital sores, hematuria, pelvic pain, urgency, vaginal bleeding and vaginal pain.  Musculoskeletal: Negative for back pain, joint swelling and myalgias.  Skin: Negative for rash.  Neurological: Positive for headaches. Negative for dizziness, syncope, light-headedness and numbness.  Hematological: Negative for adenopathy.  Psychiatric/Behavioral: Positive for dysphoric mood. Negative for agitation, confusion, sleep disturbance and suicidal ideas. The patient is not nervous/anxious.      Objective: BP (!) 134/92   Pulse 75   Ht 5' 7"  (1.702 m)   Wt 161 lb (73 kg)   LMP 06/18/2017 (Exact Date) Comment: intermittent bleeding  BMI 25.22 kg/m    Physical Exam  Constitutional: She is oriented to person,  place, and time. She appears well-developed and well-nourished.  Genitourinary: Vagina normal and uterus normal. There is no rash or tenderness on the right labia. There is no rash or tenderness on the left labia. No erythema or tenderness in the vagina. No vaginal discharge found. Right adnexum does not display mass and does not display tenderness. Left adnexum does not display mass and does not display tenderness. Cervix does not exhibit motion tenderness or polyp. Uterus is not enlarged or tender.  Neck: Normal range of motion. No thyromegaly present.  Cardiovascular: Normal rate, regular rhythm and normal heart sounds.  No  murmur heard. Pulmonary/Chest: Effort normal and breath sounds normal. Right breast exhibits no mass, no nipple discharge, no skin change and no tenderness. Left breast exhibits no mass, no nipple discharge, no skin change and no tenderness.  Abdominal: Soft. There is no tenderness. There is no guarding.  Musculoskeletal: Normal range of motion.  Neurological: She is alert and oriented to person, place, and time. No cranial nerve deficit.  Psychiatric: She has a normal mood and affect. Her behavior is normal.  Vitals reviewed.   Assessment/Plan: Encounter for annual routine gynecological examination  Cervical cancer screening - Plan: IGP, Aptima HPV  Screening for HPV (human papillomavirus) - Plan: IGP, Aptima HPV  Screening for breast cancer - Pt to sched mammo 6/19 - Plan: MM DIGITAL SCREENING BILATERAL  DUB (dysfunctional uterine bleeding) - Check labs and GYN u/s. Will f/u with results and plan. - Plan: US PELVIS TRANSVANGINAL NON-OB (TV ONLY)  Encounter for surveillance of contraceptive pills - Having DUB with yasmin. Eval further with u/s, labs. If neg, may need brand change vs other option.   Blood tests for routine general physical examination - Plan: Comprehensive metabolic panel, Lipid panel, TSH + free T4, Prolactin  Screening cholesterol level - Plan:  Lipid panel  Thyroid disorder screening - Plan: TSH + free T4  Elevated blood pressure reading in office without diagnosis of hypertension - Pt under grief stress. Rechk BPs at home when quiet.   Chest pain in adult - Episode with near syncope 12/18. Brother with early MI. Refer to cardio for further eval. Check lipids today. Pt to check BPs at home due to grief stress today. - Plan: Ambulatory referral to Cardiology  Family history of myocardial infarction in first degree female relative - Plan: Ambulatory referral to Cardiology        GYN counsel breast self exam, mammography screening, adequate intake of calcium and vitamin D, diet and exercise     F/U  Return in about 1 day (around 08/16/2017) for GYN u/s for DUB, fasting labs--[facility] to call pt.  Neev Mcmains B. Talmadge Ganas, PA-C 08/15/2017 3:02 PM

## 2017-08-16 ENCOUNTER — Other Ambulatory Visit: Payer: BLUE CROSS/BLUE SHIELD

## 2017-08-17 LAB — IGP, APTIMA HPV
HPV Aptima: NEGATIVE
PAP Smear Comment: 0

## 2017-08-17 LAB — COMPREHENSIVE METABOLIC PANEL
ALT: 20 IU/L (ref 0–32)
AST: 16 IU/L (ref 0–40)
Albumin/Globulin Ratio: 1.4 (ref 1.2–2.2)
Albumin: 4.2 g/dL (ref 3.5–5.5)
Alkaline Phosphatase: 45 IU/L (ref 39–117)
BUN/Creatinine Ratio: 16 (ref 9–23)
BUN: 11 mg/dL (ref 6–24)
Bilirubin Total: 0.6 mg/dL (ref 0.0–1.2)
CO2: 21 mmol/L (ref 20–29)
Calcium: 9 mg/dL (ref 8.7–10.2)
Chloride: 103 mmol/L (ref 96–106)
Creatinine, Ser: 0.68 mg/dL (ref 0.57–1.00)
GFR calc Af Amer: 124 mL/min/{1.73_m2} (ref >59)
GFR calc non Af Amer: 107 mL/min/{1.73_m2} (ref >59)
Globulin, Total: 2.9 g/dL (ref 1.5–4.5)
Glucose: 82 mg/dL (ref 65–99)
Potassium: 4.1 mmol/L (ref 3.5–5.2)
Sodium: 140 mmol/L (ref 134–144)
Total Protein: 7.1 g/dL (ref 6.0–8.5)

## 2017-08-17 LAB — LIPID PANEL
Chol/HDL Ratio: 3.3 ratio (ref 0.0–4.4)
Cholesterol, Total: 164 mg/dL (ref 100–199)
HDL: 50 mg/dL (ref >39)
LDL Calculated: 90 mg/dL (ref 0–99)
Triglycerides: 118 mg/dL (ref 0–149)
VLDL Cholesterol Cal: 24 mg/dL (ref 5–40)

## 2017-08-17 LAB — PROLACTIN: Prolactin: 6.8 ng/mL (ref 4.8–23.3)

## 2017-08-17 LAB — TSH+FREE T4
Free T4: 1.38 ng/dL (ref 0.82–1.77)
TSH: 0.612 u[IU]/mL (ref 0.450–4.500)

## 2017-08-18 ENCOUNTER — Ambulatory Visit (INDEPENDENT_AMBULATORY_CARE_PROVIDER_SITE_OTHER): Payer: BLUE CROSS/BLUE SHIELD

## 2017-08-18 ENCOUNTER — Telehealth: Payer: Self-pay | Admitting: Obstetrics and Gynecology

## 2017-08-18 DIAGNOSIS — N938 Other specified abnormal uterine and vaginal bleeding: Secondary | ICD-10-CM

## 2017-08-18 NOTE — Telephone Encounter (Signed)
    ULTRASOUND REPORT  Location: [facility]  Date of Service: 08/18/2017    Indications:DUB Findings:  The uterus is anteverted and measures 8.24 x 4.50 x 4.38cm. Echo texture is heterogenous without evidence of focal masses.  The Endometrium measures 2.87 mm.  Right Ovary measures 2.39 x 2.67 x 1.06 cm. It is normal in appearance. Left Ovary measures 1.61 x 1.64 x 1.35 cm. It is normal in appearance. Survey of the adnexa demonstrates no adnexal masses. There is no free fluid in the cul de sac.  Impression: 1. Heterogeneous uterus, otherwise normal.  Recommendations: 1.Clinical correlation with the patient's History and Physical Exam.   Edwena Bunde, RDMS, RVT

## 2017-08-22 ENCOUNTER — Other Ambulatory Visit: Payer: Self-pay | Admitting: Obstetrics and Gynecology

## 2017-08-22 DIAGNOSIS — Z3041 Encounter for surveillance of contraceptive pills: Secondary | ICD-10-CM

## 2017-08-22 MED ORDER — LEVONORGESTREL-ETHINYL ESTRAD 0.1-20 MG-MCG PO TABS: 1.0000 | ORAL_TABLET | Freq: Every day | ORAL | 3 refills | Status: DC

## 2017-08-22 NOTE — Telephone Encounter (Signed)
Pt aware of neg u/s and DUB labs. Wants to cont OCPs. Discussed nuvaring and IUD, too. Failed cont dosing with Lo Loestrin 2018 and yasmin no longer working for cont dosing.  Will try aviane. Rx eRxd. F/u prn.

## 2017-09-22 ENCOUNTER — Ambulatory Visit: Payer: BLUE CROSS/BLUE SHIELD | Admitting: Internal Medicine

## 2017-09-22 ENCOUNTER — Encounter: Payer: Self-pay | Admitting: Internal Medicine

## 2017-09-22 VITALS — BP 152/98 | HR 70 | Ht 67.0 in | Wt 162.2 lb

## 2017-09-22 DIAGNOSIS — R0789 Other chest pain: Secondary | ICD-10-CM | POA: Diagnosis not present

## 2017-09-22 DIAGNOSIS — R030 Elevated blood-pressure reading, without diagnosis of hypertension: Secondary | ICD-10-CM

## 2017-09-22 DIAGNOSIS — R002 Palpitations: Secondary | ICD-10-CM

## 2017-09-22 HISTORY — DX: Palpitations: R00.2

## 2017-09-22 HISTORY — DX: Other chest pain: R07.89

## 2017-09-22 NOTE — Patient Instructions (Addendum)
Testing/Procedures: Your physician has requested that you have an exercise tolerance test. For further information please visit [email or web address]. Please also follow instruction sheet, as given.  Do not drink or eat foods with caffeine for 24 hours before the test. (Chocolate, coffee, tea, or energy drinks)  If you use an inhaler, bring it with you to the test.  Do not smoke for 4 hours before the test.  Wear comfortable shoes and clothing.  [facility] [address] (3rd floor) [place] $150.00 Call [phone] to schedule   Follow-Up: Your physician recommends that you schedule a follow-up appointment in: 3 months with Dr. Saunders Revel.  It was a pleasure seeing you today here in the office. Please do not hesitate to give [place] a call back if you have any further questions. Rains, BSN     DASH Eating Plan DASH stands for "Dietary Approaches to Stop Hypertension." The DASH eating plan is a healthy eating plan that has been shown to reduce high blood pressure (hypertension). It may also reduce your risk for type 2 diabetes, heart disease, and stroke. The DASH eating plan may also help with weight loss. What are tips for following this plan? General guidelines  Avoid eating more than 2,300 mg (milligrams) of salt (sodium) a day. If you have hypertension, you may need to reduce your sodium intake to 1,500 mg a day.  Limit alcohol intake to no more than 1 drink a day for nonpregnant women and 2 drinks a day for men. One drink equals 12 oz of beer, 5 oz of wine, or 1 oz of hard liquor.  Work with your health care provider to maintain a healthy body weight or to lose weight. Ask what an ideal weight is for you.  Get at least 30 minutes of exercise that causes your heart to beat faster (aerobic exercise) most days of the week. Activities may include walking, swimming, or biking.  Work with your health care provider or diet and nutrition  specialist (dietitian) to adjust your eating plan to your individual calorie needs. Reading food labels  Check food labels for the amount of sodium per serving. Choose foods with less than 5 percent of the Daily Value of sodium. Generally, foods with less than 300 mg of sodium per serving fit into this eating plan.  To find whole grains, look for the word "whole" as the first word in the ingredient list. Shopping  Buy products labeled as "low-sodium" or "no salt added."  Buy fresh foods. Avoid canned foods and premade or frozen meals. Cooking  Avoid adding salt when cooking. Use salt-free seasonings or herbs instead of table salt or sea salt. Check with your health care provider or pharmacist before using salt substitutes.  Do not fry foods. Cook foods using healthy methods such as baking, boiling, grilling, and broiling instead.  Cook with heart-healthy oils, such as olive, canola, soybean, or sunflower oil. Meal planning   Eat a balanced diet that includes: ? 5 or more servings of fruits and vegetables each day. At each meal, try to fill half of your plate with fruits and vegetables. ? Up to 6-8 servings of whole grains each day. ? Less than 6 oz of lean meat, poultry, or fish each day. A 3-oz serving of meat is about the same size as a deck of cards. One egg equals 1 oz. ? 2 servings of low-fat dairy each day. ? A serving of nuts, seeds, or beans  5 times each week. ? Heart-healthy fats. Healthy fats called Omega-3 fatty acids are found in foods such as flaxseeds and coldwater fish, like sardines, salmon, and mackerel.  Limit how much you eat of the following: ? Canned or prepackaged foods. ? Food that is high in trans fat, such as fried foods. ? Food that is high in saturated fat, such as fatty meat. ? Sweets, desserts, sugary drinks, and other foods with added sugar. ? Full-fat dairy products.  Do not salt foods before eating.  Try to eat at least 2 vegetarian meals each  week.  Eat more home-cooked food and less restaurant, buffet, and fast food.  When eating at a restaurant, ask that your food be prepared with less salt or no salt, if possible. What foods are recommended? The items listed may not be a complete list. Talk with your dietitian about what dietary choices are best for you. Grains Whole-grain or whole-wheat bread. Whole-grain or whole-wheat pasta. Brown rice. [place]. Bulgur. Whole-grain and low-sodium cereals. Pita bread. Low-fat, low-sodium crackers. Whole-wheat flour tortillas. Vegetables Fresh or frozen vegetables (raw, steamed, roasted, or grilled). Low-sodium or reduced-sodium tomato and vegetable juice. Low-sodium or reduced-sodium tomato sauce and tomato paste. Low-sodium or reduced-sodium canned vegetables. Fruits All fresh, dried, or frozen fruit. Canned fruit in natural juice (without added sugar). Meat and other protein foods Skinless chicken or [place]. Ground chicken or [place]. Pork with fat trimmed off. Fish and seafood. Egg whites. Dried beans, peas, or lentils. Unsalted nuts, nut butters, and seeds. Unsalted canned beans. Lean cuts of beef with fat trimmed off. Low-sodium, lean deli meat. Dairy Low-fat (1%) or fat-free (skim) milk. Fat-free, low-fat, or reduced-fat cheeses. Nonfat, low-sodium ricotta or cottage cheese. Low-fat or nonfat yogurt. Low-fat, low-sodium cheese. Fats and oils Soft margarine without trans fats. Vegetable oil. Low-fat, reduced-fat, or light mayonnaise and salad dressings (reduced-sodium). Canola, safflower, olive, soybean, and sunflower oils. Avocado. Seasoning and other foods Herbs. Spices. Seasoning mixes without salt. Unsalted popcorn and pretzels. Fat-free sweets. What foods are not recommended? The items listed may not be a complete list. Talk with your dietitian about what dietary choices are best for you. Grains Baked goods made with fat, such as croissants, muffins, or some breads. Dry pasta  or rice meal packs. Vegetables Creamed or fried vegetables. Vegetables in a cheese sauce. Regular canned vegetables (not low-sodium or reduced-sodium). Regular canned tomato sauce and paste (not low-sodium or reduced-sodium). Regular tomato and vegetable juice (not low-sodium or reduced-sodium). Angie Fava. Olives. Fruits Canned fruit in a light or heavy syrup. Fried fruit. Fruit in cream or butter sauce. Meat and other protein foods Fatty cuts of meat. Ribs. Fried meat. Berniece Salines. Sausage. Bologna and other processed lunch meats. Salami. Fatback. Hotdogs. Bratwurst. Salted nuts and seeds. Canned beans with added salt. Canned or smoked fish. Whole eggs or egg yolks. Chicken or [place] with skin. Dairy Whole or 2% milk, cream, and half-and-half. Whole or full-fat cream cheese. Whole-fat or sweetened yogurt. Full-fat cheese. Nondairy creamers. Whipped toppings. Processed cheese and cheese spreads. Fats and oils Butter. Stick margarine. Lard. Shortening. Ghee. Bacon fat. Tropical oils, such as coconut, palm kernel, or palm oil. Seasoning and other foods Salted popcorn and pretzels. Onion salt, garlic salt, seasoned salt, table salt, and sea salt. Worcestershire sauce. Tartar sauce. Barbecue sauce. Teriyaki sauce. Soy sauce, including reduced-sodium. Steak sauce. Canned and packaged gravies. Fish sauce. Oyster sauce. Cocktail sauce. Horseradish that you find on the shelf. Ketchup. Mustard. Meat flavorings and tenderizers. Bouillon cubes.  Hot sauce and Tabasco sauce. Premade or packaged marinades. Premade or packaged taco seasonings. Relishes. Regular salad dressings. Where to find more information:  National Heart, Lung, and [place]: https://wilson-eaton.com/  American Heart Association: www.heart.org Summary  The DASH eating plan is a healthy eating plan that has been shown to reduce high blood pressure (hypertension). It may also reduce your risk for type 2 diabetes, heart disease, and stroke.  With the  DASH eating plan, you should limit salt (sodium) intake to 2,300 mg a day. If you have hypertension, you may need to reduce your sodium intake to 1,500 mg a day.  When on the DASH eating plan, aim to eat more fresh fruits and vegetables, whole grains, lean proteins, low-fat dairy, and heart-healthy fats.  Work with your health care provider or diet and nutrition specialist (dietitian) to adjust your eating plan to your individual calorie needs. This information is not intended to replace advice given to you by your health care provider. Make sure you discuss any questions you have with your health care provider. Document Released: 04/22/2011 Document Revised: 04/26/2016 Document Reviewed: 04/26/2016 Elsevier Interactive Patient Education  2018 [employer].  Exercise Stress Electrocardiogram An exercise stress electrocardiogram is a test to check how blood flows to your heart. It is done to find areas of poor blood flow. You will need to walk on a treadmill for this test. The electrocardiogram will record your heartbeat when you are at rest and when you are exercising. What happens before the procedure?  Do not have drinks with caffeine or foods with caffeine for 24 hours before the test, or as told by your doctor. This includes coffee, tea (even decaf tea), sodas, chocolate, and cocoa.  Follow your doctor's instructions about eating and drinking before the test.  Ask your doctor what medicines you should or should not take before the test. Take your medicines with water unless told by your doctor not to.  If you use an inhaler, bring it with you to the test.  Bring a snack to eat after the test.  Do not  smoke for 4 hours before the test.  Do not put lotions, powders, creams, or oils on your chest before the test.  Wear comfortable shoes and clothing. What happens during the procedure?  You will have patches put on your chest. Small areas of your chest may need to be shaved. Wires  will be connected to the patches.  Your heart rate will be watched while you are resting and while you are exercising.  You will walk on the treadmill. The treadmill will slowly get faster to raise your heart rate.  The test will take about 1-2 hours. What happens after the procedure?  Your heart rate and blood pressure will be watched after the test.  You may return to your normal diet, activities, and medicines or as told by your doctor. This information is not intended to replace advice given to you by your health care provider. Make sure you discuss any questions you have with your health care provider. Document Released: 10/20/2007 Document Revised: 12/31/2015 Document Reviewed: 01/08/2013 Elsevier Interactive Patient Education  [employer].

## 2017-09-22 NOTE — Progress Notes (Signed)
New Outpatient Visit Date: 09/22/2017  Referring Provider: Chad Cordial, PA-C [address] [place], [place] [address]  Chief Complaint: Chest pain  HPI:  Lori Dawson is a 42 y.o. female who is being seen today for the evaluation of chest pain at the request of Ms. Copland. She has a history of degenerative disc disease the lumbar spine and seasonal allergies.  Ms. Buendia was seen by her obstetrician in early April, at which time she reported occasional episodes of hot flashes, nausea, near syncope, and chest pain in 04/2017.  This was complicated by the recent death of her brother at age 48 from an MI.  Ms. Rizzolo reports a few episodes of nausea and feeling warm in December.  While at work, she had a more profound episode of nausea, palpitations, chest tightness, and warmth that caused her to lie down for approximately 15 minutes and try to cool off.  Symptoms gradually resolved and have not recurred.  She describes the chest discomfort as a central chest tightness.  She is concerned because her brother passed away at age 49 earlier this year.  He had not been symptomatic and autopsy showed evidence of atherosclerotic heart disease.  Ms. Branca does not exercise regularly but does not have any symptoms with activity.  She consumes caffeinated water (2/day).  She denies a personal history of cardiac disease.  She has not undergone cardiac testing in the past.  She notes that her blood pressure has been gradually rising in the last few years, though she has not been diagnosed with hypertension.  --------------------------------------------------------------------------------------------------  Cardiovascular History & Procedures: Cardiovascular Problems:  Atypical chest pain  Capitation  Risk Factors:  Family history of premature cardiovascular disease  Cath/PCI:  None  CV Surgery:  None  EP Procedures and Devices:  None  Non-Invasive Evaluation(s):  None  Recent CV  Pertinent Labs: Lab Results  Component Value Date   CHOL 164 08/16/2017   HDL 50 08/16/2017   LDLCALC 90 08/16/2017   TRIG 118 08/16/2017   CHOLHDL 3.3 08/16/2017   K 4.1 08/16/2017   BUN 11 08/16/2017   CREATININE 0.68 08/16/2017    --------------------------------------------------------------------------------------------------  Past Medical History:  Diagnosis Date  . Allergy    seasonal  . 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 Herniated lumbar intervertebral disc   . Migraine headache     Past Surgical History:  Procedure Laterality Date  . AUGMENTATION MAMMAPLASTY  2004   in [place], right breast was larger  . BREAST BIOPSY Left 11/18/2016   core bx Byrnett  . TUBAL LIGATION  2006    Current Meds  Medication Sig  . Azelastine-Fluticasone 137-50 MCG/ACT SUSP Place into the nose.  . cetirizine (ZYRTEC) 10 MG tablet Take 10 mg by mouth daily.  . cyclobenzaprine (FLEXERIL) 10 MG tablet Take 10 mg by mouth 3 (three) times daily as needed for muscle spasms.  . fluconazole (DIFLUCAN) 200 MG tablet   . ibuprofen (ADVIL,MOTRIN) 400 MG tablet Take 400 mg by mouth as needed.  Marland Kitchen levonorgestrel-ethinyl estradiol (AVIANE) 0.1-20 MG-MCG tablet Take 1 tablet by mouth daily. CONTINUOUS DOSING  . montelukast (SINGULAIR) 10 MG tablet   . Olopatadine HCl 0.2 % SOLN   . [DISCONTINUED] drospirenone-ethinyl estradiol (YASMIN,ZARAH,SYEDA) 3-0.03 MG tablet TAKE 1 TABLET BY MOUTH DAILY. CONTINUOUS DOSING. ANNUAL DUE 07/2017    Allergies: Levofloxacin  Social History   Tobacco Use  . Smoking status: Never Smoker  . Smokeless tobacco: Never Used  Substance Use  Topics  . Alcohol use: Yes    Alcohol/week: 1.8 oz    Types: 3 Glasses of wine per week  . Drug use: Never    Family History  Problem Relation Age of Onset  . Cervical cancer Maternal Aunt   . Diabetes Paternal Grandmother   . Lung cancer Paternal Grandmother   . Lung cancer Paternal Grandfather   . Thyroid  disease Mother   . Hypertension Father   . Hyperlipidemia Father   . Heart attack Brother 10  . Coronary artery disease Brother   . Coronary artery disease Maternal Grandfather   . Breast cancer Neg Hx     Review of Systems: A 12-system review of systems was performed and was negative except as noted in the HPI.  --------------------------------------------------------------------------------------------------  Physical Exam: BP (!) 152/98 (BP Location: Right Arm, Patient Position: Sitting, Cuff Size: Normal)   Pulse 70   Ht 5' 7"  (1.702 m)   Wt 162 lb 4 oz (73.6 kg)   BMI 25.41 kg/m    General: NAD. HEENT: No conjunctival pallor or scleral icterus. Moist mucous membranes. OP clear. Neck: Supple without lymphadenopathy, thyromegaly, JVD, or HJR. No carotid bruit. Lungs: Normal work of breathing. Clear to auscultation bilaterally without wheezes or crackles. Heart: Regular rate and rhythm without murmurs, rubs, or gallops. Non-displaced PMI. Abd: Bowel sounds present. Soft, NT/ND without hepatosplenomegaly Ext: No lower extremity edema. Radial, PT, and DP pulses are 2+ bilaterally Skin: Warm and dry without rash. Neuro: CNIII-XII intact. Strength and fine-touch sensation intact in upper and lower extremities bilaterally. Psych: Normal mood and affect.  EKG: Normal sinus rhythm with low voltage.  No significant abnormalities.  No results found for: WBC, HGB, HCT, MCV, PLT  Lab Results  Component Value Date   NA 140 08/16/2017   K 4.1 08/16/2017   CL 103 08/16/2017   CO2 21 08/16/2017   BUN 11 08/16/2017   CREATININE 0.68 08/16/2017   GLUCOSE 82 08/16/2017   ALT 20 08/16/2017    Lab Results  Component Value Date   CHOL 164 08/16/2017   HDL 50 08/16/2017   LDLCALC 90 08/16/2017   TRIG 118 08/16/2017   CHOLHDL 3.3 08/16/2017   Lab Results  Component Value Date   TSH 0.612 08/16/2017     --------------------------------------------------------------------------------------------------  ASSESSMENT AND PLAN: Atypical chest pain and palpitations Symptoms were isolated to December.  Symptoms are atypical for coronary artery disease.  Risk factors include family history and elevated blood pressure, though Ms. Kiel does not have a history of hypertension.  We have agreed to obtain an exercise tolerance test and coronary calcium score for further risk stratification, given her mother's death of coronary artery disease in his mid 71s.  We will defer ambulatory cardiac monitoring given isolated episode of palpitations.  Elevated blood pressure Blood pressure modestly elevated today.  I have encouraged Ms. Polimeni to limit her sodium intake and to check her blood pressure at home.  We will defer medication changes at this time, though if her pressures remain elevated, pharmacologic therapy will need to be considered.  Follow-up: Return to clinic in 3 months.  Nelva Bush, MD 09/22/2017 12:36 PM

## 2017-09-28 ENCOUNTER — Ambulatory Visit (INDEPENDENT_AMBULATORY_CARE_PROVIDER_SITE_OTHER): Payer: BLUE CROSS/BLUE SHIELD

## 2017-09-28 DIAGNOSIS — R0789 Other chest pain: Secondary | ICD-10-CM

## 2017-09-30 LAB — EXERCISE TOLERANCE TEST
Estimated workload: 12.4 METS
Exercise duration (min): 10 min
Exercise duration (sec): 27 s
MPHR: 177 {beats}/min
Peak HR: 169 {beats}/min
Percent HR: 95 %
RPE: 15
Rest HR: 81 {beats}/min

## 2017-10-01 ENCOUNTER — Encounter: Payer: Self-pay | Admitting: Internal Medicine

## 2017-10-06 ENCOUNTER — Ambulatory Visit (INDEPENDENT_AMBULATORY_CARE_PROVIDER_SITE_OTHER)
Admission: RE | Admit: 2017-10-06 | Discharge: 2017-10-06 | Disposition: A | Discharge status: Home / Self Care | Payer: Self-pay | Source: Ambulatory Visit | Attending: Internal Medicine | Admitting: Internal Medicine

## 2017-10-06 DIAGNOSIS — R0789 Other chest pain: Secondary | ICD-10-CM

## 2017-11-02 ENCOUNTER — Encounter: Payer: Self-pay | Admitting: Obstetrics and Gynecology

## 2017-11-02 ENCOUNTER — Ambulatory Visit
Admission: RE | Admit: 2017-11-02 | Discharge: 2017-11-02 | Disposition: A | Discharge status: Home / Self Care | Payer: BLUE CROSS/BLUE SHIELD | Source: Ambulatory Visit | Attending: Obstetrics and Gynecology | Admitting: Obstetrics and Gynecology

## 2017-11-02 DIAGNOSIS — Z1231 Encounter for screening mammogram for malignant neoplasm of breast: Secondary | ICD-10-CM | POA: Insufficient documentation

## 2017-11-02 DIAGNOSIS — Z1239 Encounter for other screening for malignant neoplasm of breast: Secondary | ICD-10-CM

## 2017-11-09 ENCOUNTER — Other Ambulatory Visit: Payer: Self-pay | Admitting: Obstetrics and Gynecology

## 2017-11-09 ENCOUNTER — Encounter: Payer: Self-pay | Admitting: Obstetrics and Gynecology

## 2017-11-09 MED ORDER — SERTRALINE HCL 50 MG PO TABS: ORAL_TABLET | ORAL | 1 refills | Status: DC

## 2017-11-09 NOTE — Progress Notes (Signed)
Rx zoloft for anxiety after sudden death of brother. F/u in 7 wks.

## 2017-11-09 NOTE — Telephone Encounter (Signed)
Please Advise

## 2017-11-22 ENCOUNTER — Telehealth: Payer: Self-pay | Admitting: Obstetrics and Gynecology

## 2017-11-22 NOTE — Telephone Encounter (Signed)
Spoke with pt re: BTB on OCPs. Is trying aviane and on month 3, but still having BTB. Had neg labs/GYN u/s for BTB on OCPs. Has had BTB on a couple different pills. Felt dry with nuvaring. Also mentioned IUD. Pt wants to try aviane a couple more months and will f/u prn.

## 2017-12-29 ENCOUNTER — Encounter: Payer: Self-pay | Admitting: Obstetrics and Gynecology

## 2017-12-29 ENCOUNTER — Ambulatory Visit (INDEPENDENT_AMBULATORY_CARE_PROVIDER_SITE_OTHER): Payer: BLUE CROSS/BLUE SHIELD | Admitting: Obstetrics and Gynecology

## 2017-12-29 ENCOUNTER — Other Ambulatory Visit: Payer: Self-pay | Admitting: Obstetrics and Gynecology

## 2017-12-29 VITALS — BP 120/80 | HR 67 | Ht 67.0 in | Wt 166.0 lb

## 2017-12-29 DIAGNOSIS — N921 Excessive and frequent menstruation with irregular cycle: Secondary | ICD-10-CM

## 2017-12-29 DIAGNOSIS — F4321 Adjustment disorder with depressed mood: Secondary | ICD-10-CM | POA: Diagnosis not present

## 2017-12-29 DIAGNOSIS — F419 Anxiety disorder, unspecified: Secondary | ICD-10-CM | POA: Diagnosis not present

## 2017-12-29 DIAGNOSIS — F32A Depression, unspecified: Secondary | ICD-10-CM | POA: Insufficient documentation

## 2017-12-29 MED ORDER — SERTRALINE HCL 50 MG PO TABS: 50.0000 mg | ORAL_TABLET | Freq: Every day | ORAL | 2 refills | Status: DC

## 2017-12-29 NOTE — Progress Notes (Signed)
Patient, No Pcp Per   Chief Complaint  Patient presents with  . Follow-up    Medication is working per pt    HPI:      [name] is a 42 y.o. G2P0 who LMP was Patient's last menstrual period was 11/27/2017 (approximate)., presents today for anxiety/grief depression f/u. Pt lost brother unexpectedly 3/19 and started zoloft 6/19. Long hx of anxiety but sx worsened after his passing. Pt had excessive worry, felt panicked, overwhelmed. Doing much better with zoloft. No side effects. Wants to cont meds. Hasn't seen counselor but has learned coping skills from daughters doing therapy. No SI.   BTB with OCPs improved so far with aviane. Had neg DUB eval.    Past Medical History:  Diagnosis Date  . Allergy    seasonal  . 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 Herniated lumbar intervertebral disc   . Migraine headache     Past Surgical History:  Procedure Laterality Date  . AUGMENTATION MAMMAPLASTY  2004   in [place], right breast was larger  . BREAST BIOPSY Left 11/18/2016   core bx Byrnett  . TUBAL LIGATION  2006    Family History  Problem Relation Age of Onset  . Cervical cancer Maternal Aunt   . Diabetes Paternal Grandmother   . Lung cancer Paternal Grandmother   . Lung cancer Paternal Grandfather   . Thyroid disease Mother   . Hypertension Father   . Hyperlipidemia Father   . Heart attack Brother 62  . Coronary artery disease Brother   . Coronary artery disease Maternal Grandfather   . Breast cancer Neg Hx     Social History   Socioeconomic History  . Marital status: Married    Spouse name: Not on file  . Number of children: Not on file  . Years of education: Not on file  . Highest education level: Not on file  Occupational History  . Not on file  Social Needs  . Financial resource strain: Not on file  . Food insecurity:    Worry: Not on file    Inability: Not on file  . Transportation needs:    Medical: Not on file    Non-medical: Not  on file  Tobacco Use  . Smoking status: Never Smoker  . Smokeless tobacco: Never Used  Substance and Sexual Activity  . Alcohol use: Yes    Alcohol/week: 3.0 standard drinks    Types: 3 Glasses of wine per week  . Drug use: Never  . Sexual activity: Yes    Birth control/protection: Surgical    Comment: Tubal ligation   Lifestyle  . Physical activity:    Days per week: Not on file    Minutes per session: Not on file  . Stress: Not on file  Relationships  . Social connections:    Talks on phone: Not on file    Gets together: Not on file    Attends religious service: Not on file    Active member of club or organization: Not on file    Attends meetings of clubs or organizations: Not on file    Relationship status: Not on file  . Intimate partner violence:    Fear of current or ex partner: Not on file    Emotionally abused: Not on file    Physically abused: Not on file    Forced sexual activity: Not on file  Other Topics Concern  . Not on file  Social History Narrative  .  Not on file    Outpatient Medications Prior to Visit  Medication Sig Dispense Refill  . cetirizine (ZYRTEC) 10 MG tablet Take 10 mg by mouth daily.    . cyclobenzaprine (FLEXERIL) 5 MG tablet Take by mouth.    Marland Kitchen ibuprofen (ADVIL,MOTRIN) 400 MG tablet Take 400 mg by mouth as needed.    Marland Kitchen levonorgestrel-ethinyl estradiol (AVIANE) 0.1-20 MG-MCG tablet Take 1 tablet by mouth daily. CONTINUOUS DOSING 84 tablet 3  . montelukast (SINGULAIR) 10 MG tablet     . Olopatadine HCl 0.2 % SOLN     . sertraline (ZOLOFT) 50 MG tablet Take 1/2 tab daily for 6 days, then 1 tab daily 30 tablet 1  . Azelastine-Fluticasone 137-50 MCG/ACT SUSP Place into the nose.    . cyclobenzaprine (FLEXERIL) 10 MG tablet Take 10 mg by mouth 3 (three) times daily as needed for muscle spasms.    . fluconazole (DIFLUCAN) 200 MG tablet   0   No facility-administered medications prior to visit.       ROS:  Review of Systems    Constitutional: Negative for fever.  Gastrointestinal: Negative for blood in stool, constipation, diarrhea, nausea and vomiting.  Genitourinary: Negative for dyspareunia, dysuria, flank pain, frequency, hematuria, urgency, vaginal bleeding, vaginal discharge and vaginal pain.  Musculoskeletal: Negative for back pain.  Skin: Negative for rash.  Psychiatric/Behavioral: Positive for agitation. Negative for confusion, dysphoric mood, self-injury and suicidal ideas.    OBJECTIVE:   Vitals:  BP 120/80   Pulse 67   Ht 5' 7"  (1.702 m)   Wt 166 lb (75.3 kg)   LMP 11/27/2017 (Approximate)   BMI 26.00 kg/m   Physical Exam  Constitutional: She is oriented to person, place, and time. She appears well-developed.  Neck: Normal range of motion.  Pulmonary/Chest: Effort normal.  Musculoskeletal: Normal range of motion.  Neurological: She is alert and oriented to person, place, and time. No cranial nerve deficit.  Psychiatric: She has a normal mood and affect. Her behavior is normal. Judgment and thought content normal.  Vitals reviewed.   Assessment/Plan: Anxiety - Sx improved with zoloft. Rx RF. F/u prn.   Grief reaction - Discussed seeing therapist. Names given. F/u prn.  Breakthrough bleeding on birth control pills - Sx improved with aviane. F/u prn.    Meds ordered this encounter  Medications  . sertraline (ZOLOFT) 50 MG tablet    Sig: Take 1 tablet (50 mg total) by mouth daily. Take 1/2 tab daily for 6 days, then 1 tab daily    Dispense:  90 tablet    Refill:  2    Order Specific Question:   Supervising Provider    Answer:   Gae Dry [[id number]]      Return if symptoms worsen or fail to improve.  Alicia B. Copland, PA-C 12/29/2017 5:09 PM

## 2017-12-29 NOTE — Patient Instructions (Signed)
I value your feedback and entrusting us with your care. If you get a [facility] patient survey, I would appreciate you taking the time to let us know about your experience today. Thank you! 

## 2018-01-11 ENCOUNTER — Ambulatory Visit: Payer: BLUE CROSS/BLUE SHIELD | Admitting: Internal Medicine

## 2018-04-14 IMAGING — MG MM DIGITAL DIAGNOSTIC UNILAT*L* IMPLANT W/ TOMO W/ CAD
6 series · 6 of 14 positions shown · non-contrast
Comparison: Previous exam(s).

CLINICAL DATA: Screening recall for a possible mass in the left
breast.

EXAM:
2D DIGITAL DIAGNOSTIC LEFT MAMMOGRAM WITH IMPLANTS, CAD AND ADJUNCT
TOMO
ULTRASOUND LEFT BREAST
The patient has retropectoral implants. Standard and implant
displaced views were performed.

[L CC synth-2D]
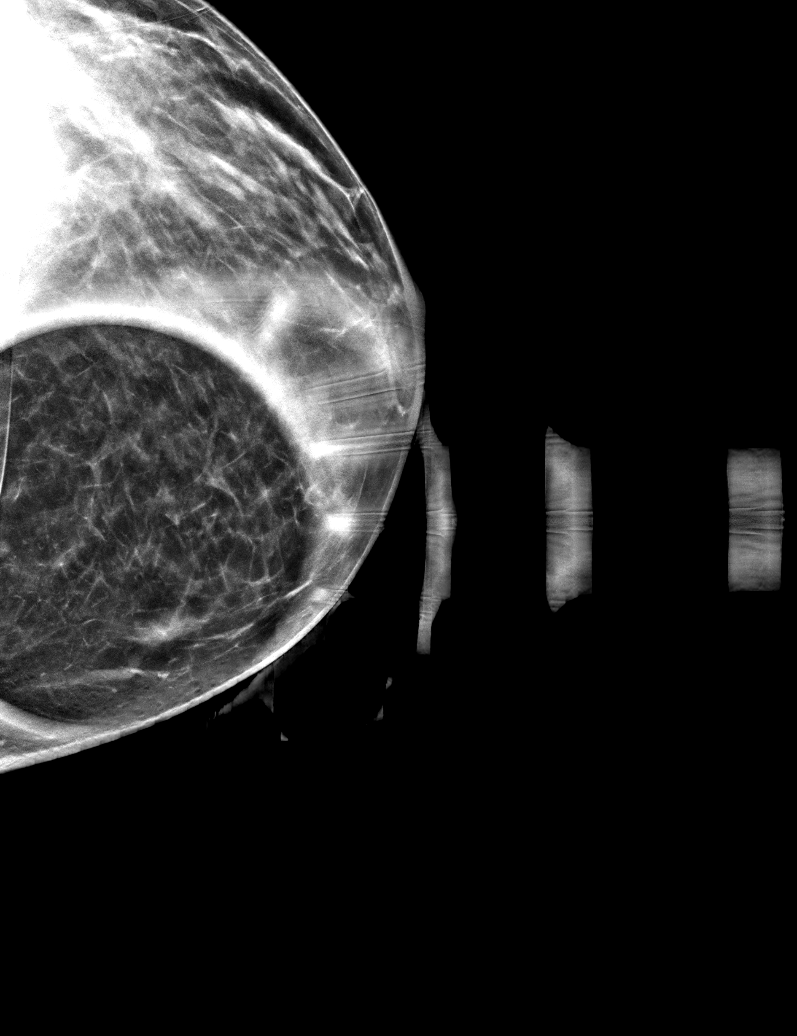

[L CC]
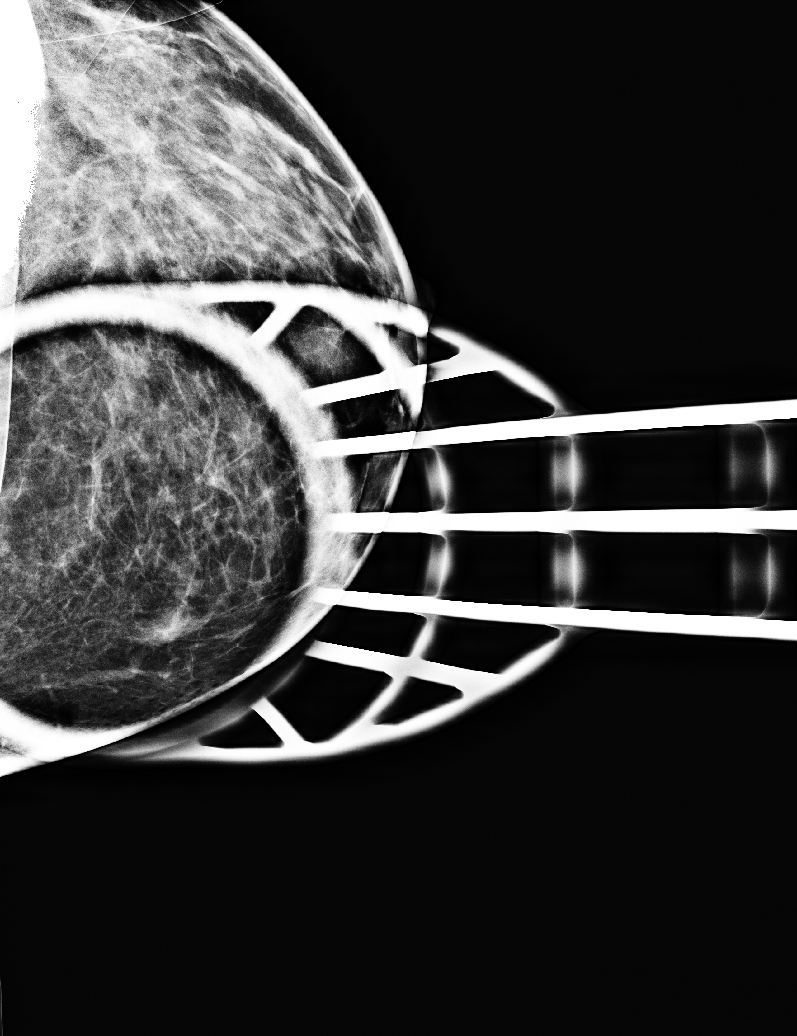

[L MLO synth-2D]
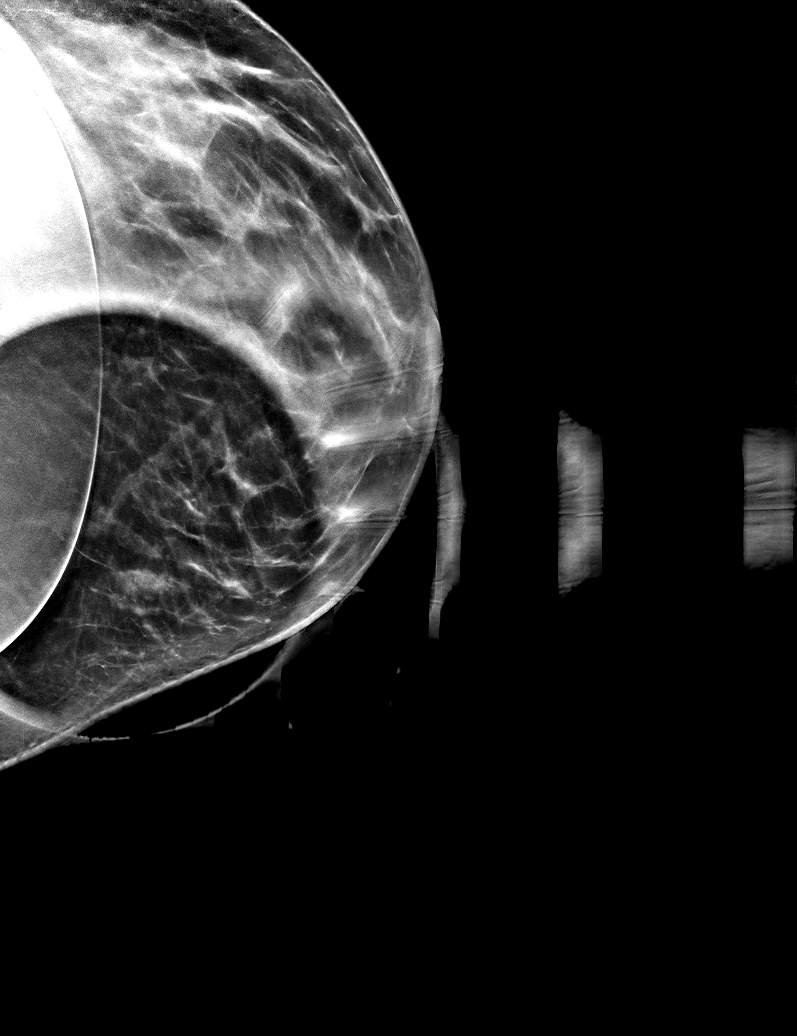

[L MLO]
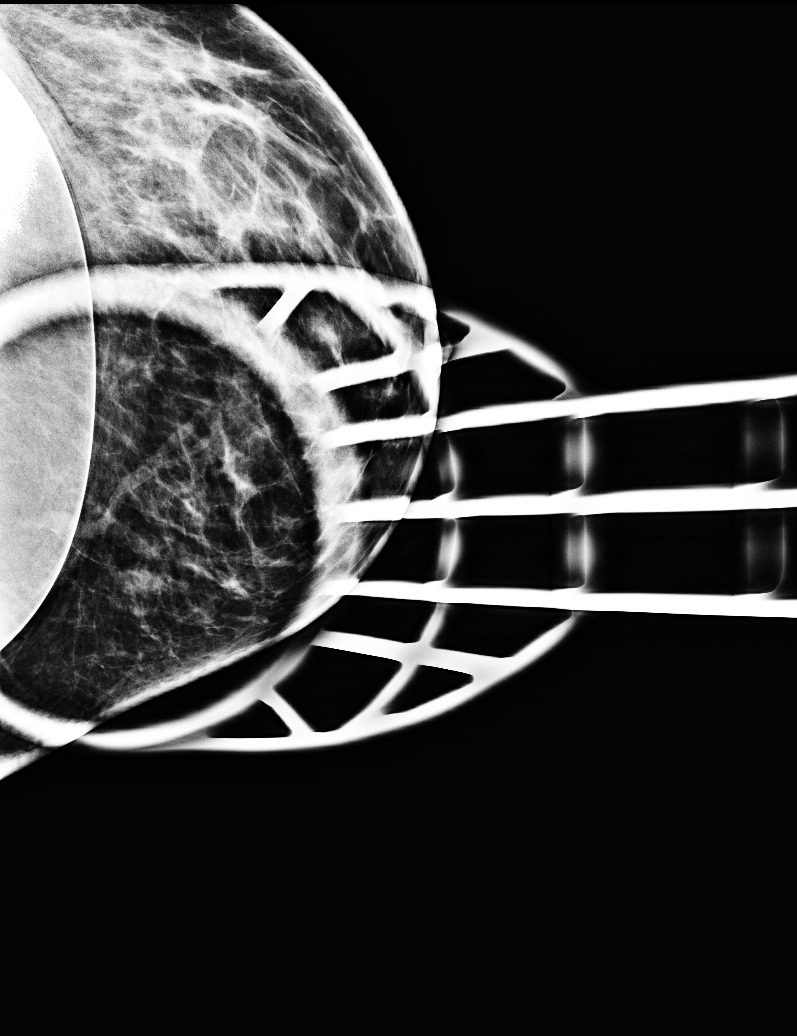

[L CC tomo · tomo slice 27/53.0]
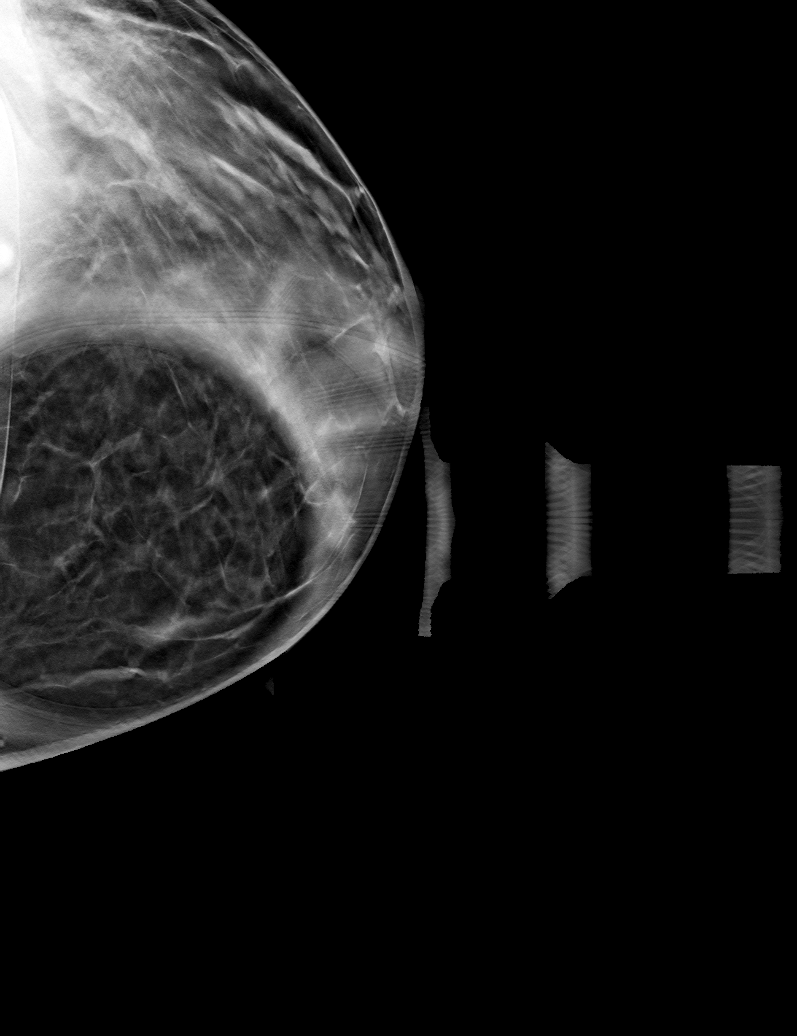

[L MLO tomo · tomo slice 31/61.0]
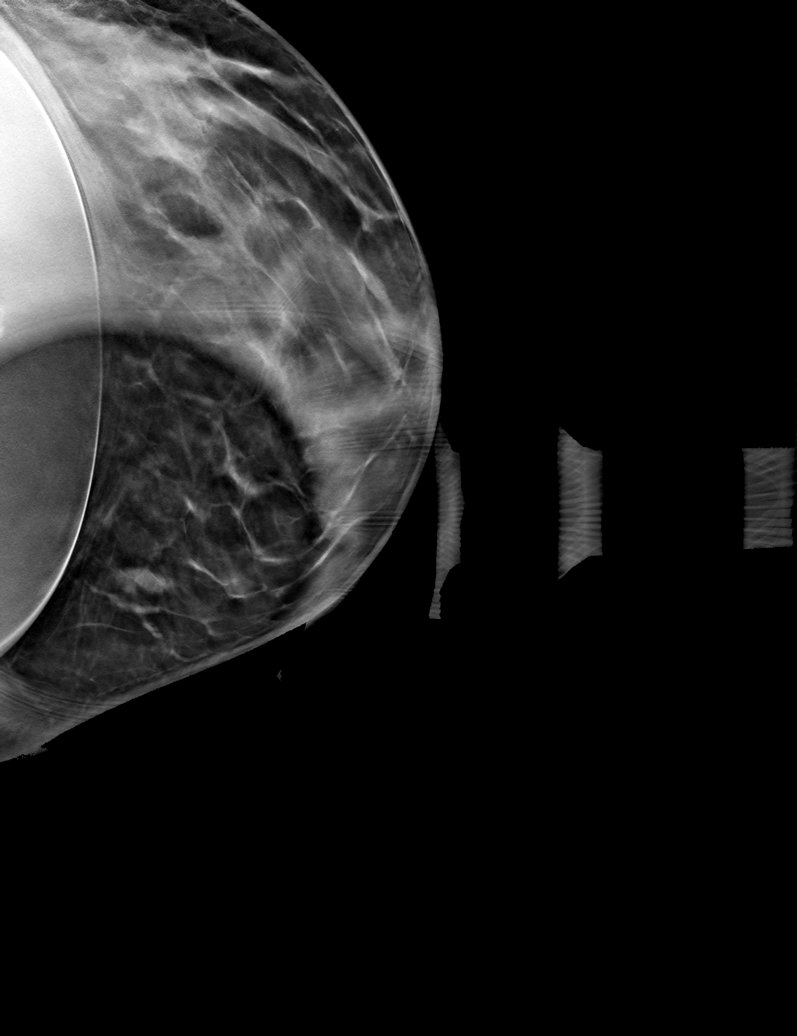

[6 of 14 positions shown; findings below may reference images not displayed]

ACR Breast Density Category b: There are scattered areas of
fibroglandular density.
FINDINGS: On the diagnostic spot-compression 2D and 3D images, the focal
opacity in the lower inner left breast persists. There are partly
defined margins suggesting elongated or tubular mass in this
location. There is no architectural distortion.

On physical exam, no mass is palpated along the lower inner left
breast.

Targeted ultrasound is performed, showing somewhat day area of
decreased echogenicity, with no internal blood flow, with an
elongated or tubular configuration, in the left breast at 8 o'clock,
6 cm the nipple, just superficial to the implant capsule, measuring
14 mm x 2 mm long axis. A component of this is more nodular
measuring approximately 6 x 4 x 6 mm.

Mammographic images were processed with CAD.
IMPRESSION: Probably benign abnormality in the lower inner quadrant left breast,
likely a complicated cyst with associated prominent duct. Short-term
follow-up recommended.

RECOMMENDATION:
Diagnostic left breast mammography and left breast ultrasound in 6
months.

I have discussed the findings and recommendations with the patient.
Results were also provided in writing at the conclusion of the
visit. If applicable, a reminder letter will be sent to the patient
regarding the next appointment.

BI-RADS CATEGORY  3: Probably benign.

## 2018-04-22 ENCOUNTER — Other Ambulatory Visit: Payer: Self-pay | Admitting: Obstetrics and Gynecology

## 2018-06-26 ENCOUNTER — Encounter: Payer: Self-pay | Admitting: Obstetrics and Gynecology

## 2018-06-27 ENCOUNTER — Other Ambulatory Visit: Payer: Self-pay | Admitting: Obstetrics and Gynecology

## 2018-06-27 MED ORDER — BUPROPION HCL ER (SR) 150 MG PO TB12: ORAL_TABLET | ORAL | 1 refills | Status: DC

## 2018-06-27 NOTE — Progress Notes (Signed)
Rx wellbutrin, change from zoloft.

## 2018-08-03 ENCOUNTER — Encounter: Payer: Self-pay | Admitting: Obstetrics and Gynecology

## 2018-08-03 ENCOUNTER — Other Ambulatory Visit: Payer: Self-pay | Admitting: Obstetrics and Gynecology

## 2018-08-03 MED ORDER — SERTRALINE HCL 50 MG PO TABS: 50.0000 mg | ORAL_TABLET | Freq: Every day | ORAL | 0 refills | Status: DC

## 2018-08-03 NOTE — Progress Notes (Signed)
Rx zoloft to restart. Wellbutrin not helping anxiety/depression sx. Did better on zoloft. F/u via phone in 7 wks re: sx. May need to increase dose.

## 2018-08-17 ENCOUNTER — Ambulatory Visit: Payer: BLUE CROSS/BLUE SHIELD | Admitting: Obstetrics and Gynecology

## 2018-08-17 ENCOUNTER — Other Ambulatory Visit: Payer: Self-pay | Admitting: Obstetrics and Gynecology

## 2018-10-03 ENCOUNTER — Other Ambulatory Visit: Payer: Self-pay | Admitting: Obstetrics and Gynecology

## 2018-10-18 NOTE — Patient Instructions (Addendum)
I value your feedback and entrusting [place] with your care. If you get a [facility] patient survey, I would appreciate you taking the time to let us know about your experience today. Thank you!  [place] at [facility]: [phone]

## 2018-10-18 NOTE — Progress Notes (Signed)
Chief Complaint  Patient presents with  . Gynecologic Exam     HPI:      Lori Dawson is a 43 y.o. G2P0 who LMP was Patient's last menstrual period was 09/30/2018 (exact date)., presents today for her annual examination. Her menses are Q5 wks with cont dosing OCPs, lasting 7-10 days, 1-2 heavier days. No BTB, mild dysmen, improved with NSAIDs. Used to be able to go 3 months with OCPs but not anymore. Had neg DUB eval last yr with labs and u/s. Changed OCPs a couple times without any sx change. Pt ok with menses for now.   Sex activity: single partner, contraception - tubal ligation.  Last Pap: 08/15/17  Results were: no abnormalities /neg HPV DNA  Hx of STDs: none  Last mammogram: 11/02/17  Results were: normal; repeat in 12 months.  There is no FH of breast cancer. There is no FH of ovarian cancer. The patient does do self-breast exams.  Tobacco use: The patient denies current or previous tobacco use. Alcohol use: social drinker Exercise: moderately active  She does get adequate calcium and Vitamin D in her diet.  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 Doing well with it. Wants to continue for now but may want to wean off later in yr. Needs RF.   Pt complains of drenchig night sweats and feeling hot during the day. Wakes up wet many nights but no necessary triggers. Sx for many months. Hx of thyroid issues in her mom, but pt had normal TSH and CMP last yr.   Also complains of bilat arm aching (around joints but not in them or muscles specifically) and weakness if she has to grasp item with her hands. Has full ROM. Hx of herniated disc in low back treated with steroid inj by spine specialist. She wonders if she should go back to him.   Past Medical History:  Diagnosis Date  . Allergy    seasonal  . 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 Herniated lumbar intervertebral disc   . Migraine headache     Past Surgical History:   Procedure Laterality Date  . AUGMENTATION MAMMAPLASTY  2004   in [place], right breast was larger  . BREAST BIOPSY Left 11/18/2016   core bx Byrnett  . TUBAL LIGATION  2006    Family History  Problem Relation Age of Onset  . Cervical cancer Maternal Aunt   . Diabetes Paternal Grandmother   . Lung cancer Paternal Grandmother   . Lung cancer Paternal Grandfather   . Thyroid disease Mother   . Hypertension Father   . Hyperlipidemia Father   . Heart attack Brother 13  . Coronary artery disease Brother   . Coronary artery disease Maternal Grandfather   . Breast cancer Neg Hx     Social History   Socioeconomic History  . Marital status: Married    Spouse name: Not on file  . Number of children: Not on file  . Years of education: Not on file  . Highest education level: Not on file  Occupational History  . Not on file  Social Needs  . Financial resource strain: Not on file  . Food insecurity:    Worry: Not on file    Inability: Not on file  . Transportation needs:    Medical: Not on file    Non-medical: Not on file  Tobacco Use  . Smoking status: Never Smoker  .  Smokeless tobacco: Never Used  Substance and Sexual Activity  . Alcohol use: Yes    Alcohol/week: 3.0 standard drinks    Types: 3 Glasses of wine per week  . Drug use: Never  . Sexual activity: Yes    Birth control/protection: Surgical    Comment: Tubal ligation   Lifestyle  . Physical activity:    Days per week: Not on file    Minutes per session: Not on file  . Stress: Not on file  Relationships  . Social connections:    Talks on phone: Not on file    Gets together: Not on file    Attends religious service: Not on file    Active member of club or organization: Not on file    Attends meetings of clubs or organizations: Not on file    Relationship status: Not on file  . Intimate partner violence:    Fear of current or ex partner: Not on file    Emotionally abused: Not on file    Physically abused:  Not on file    Forced sexual activity: Not on file  Other Topics Concern  . Not on file  Social History Narrative  . Not on file    Current Outpatient Medications on File Prior to Visit  Medication Sig Dispense Refill  . cetirizine (ZYRTEC) 10 MG tablet Take 10 mg by mouth daily.    Marland Kitchen ibuprofen (ADVIL,MOTRIN) 400 MG tablet Take 400 mg by mouth as needed.    . montelukast (SINGULAIR) 10 MG tablet     . Olopatadine HCl 0.2 % SOLN      No current facility-administered medications on file prior to visit.       ROS:  Review of Systems  Constitutional: Negative for fatigue, fever and unexpected weight change.  Respiratory: Negative for cough, shortness of breath and wheezing.   Cardiovascular: Negative for chest pain, palpitations and leg swelling.  Gastrointestinal: Negative for blood in stool, constipation, diarrhea, nausea and vomiting.  Endocrine: Negative for cold intolerance, heat intolerance and polyuria.  Genitourinary: Negative for dyspareunia, dysuria, flank pain, frequency, genital sores, hematuria, menstrual problem, pelvic pain, urgency, vaginal bleeding, vaginal discharge and vaginal pain.  Musculoskeletal: Positive for arthralgias and myalgias. Negative for back pain and joint swelling.  Skin: Negative for rash.  Neurological: Negative for dizziness, syncope, light-headedness, numbness and headaches.  Hematological: Negative for adenopathy.  Psychiatric/Behavioral: Positive for agitation and dysphoric mood. Negative for confusion, sleep disturbance and suicidal ideas. The patient is not nervous/anxious.      Objective: BP 128/90   Ht 5' 7"  (1.702 m)   Wt 178 lb 12.8 oz (81.1 kg)   LMP 09/30/2018 (Exact Date)   BMI 28.00 kg/m    Physical Exam Constitutional:      Appearance: She is well-developed.  Genitourinary:     Vulva, vagina, uterus, right adnexa and left adnexa normal.     No vulval lesion or tenderness noted.     No vaginal discharge, erythema or  tenderness.     No cervical motion tenderness or polyp.     Uterus is not enlarged or tender.     No right or left adnexal mass present.     Right adnexa not tender.     Left adnexa not tender.  Neck:     Musculoskeletal: Normal range of motion.     Thyroid: No thyromegaly.  Cardiovascular:     Rate and Rhythm: Normal rate and regular rhythm.     Heart  sounds: Normal heart sounds. No murmur.  Pulmonary:     Effort: Pulmonary effort is normal.     Breath sounds: Normal breath sounds.  Chest:     Breasts:        Right: No mass, nipple discharge, skin change or tenderness.        Left: No mass, nipple discharge, skin change or tenderness.  Abdominal:     Palpations: Abdomen is soft.     Tenderness: There is no abdominal tenderness. There is no guarding.  Musculoskeletal: Normal range of motion.  Neurological:     General: No focal deficit present.     Mental Status: She is alert and oriented to person, place, and time.     Cranial Nerves: No cranial nerve deficit.  Skin:    General: Skin is warm and dry.  Psychiatric:        Mood and Affect: Mood normal.        Behavior: Behavior normal.        Thought Content: Thought content normal.        Judgment: Judgment normal.  Vitals signs reviewed.     Assessment/Plan: Encounter for annual routine gynecological examination  Screening for breast cancer - Pt to sched mammo. - Plan: MM 3D SCREEN BREAST BILATERAL  Encounter for surveillance of contraceptive pills - OCP RF. F/u prn.  - Plan: levonorgestrel-ethinyl estradiol (LARISSIA) 0.1-20 MG-MCG tablet  Grief reaction - Rx RF zoloft. F/u prn - Plan: sertraline (ZOLOFT) 50 MG tablet  Night sweats - Check labs. Doubt hormone related since on OCPs. Most likely stress. - Plan: Comprehensive metabolic panel, CBC with Differential/Platelet, TSH + free T4, HIV Antibody (routine testing w rflx), QuantiFERON-TB Gold Plus  Pain in both upper extremities - Pt to f/u with spine specialist  she has seen in past for initial eval.   Meds ordered this encounter  Medications  . sertraline (ZOLOFT) 50 MG tablet    Sig: Take 1 tablet (50 mg total) by mouth daily.    Dispense:  90 tablet    Refill:  3    Order Specific Question:   Supervising Provider    Answer:   Gae Dry [id number]  . levonorgestrel-ethinyl estradiol (LARISSIA) 0.1-20 MG-MCG tablet    Sig: TAKE 1 TABLET BY MOUTH EVERY DAY CONTINUOUS DOSING    Dispense:  84 tablet    Refill:  3    Order Specific Question:   Supervising Provider    Answer:   Gae Dry [[id number]]          GYN counsel breast self exam, mammography screening, adequate intake of calcium and vitamin D, diet and exercise     F/U  Return in about 1 year (around 10/19/2019).  Corrina Steffensen B. [place], PA-C 10/19/2018 11:40 AM

## 2018-10-19 ENCOUNTER — Ambulatory Visit (INDEPENDENT_AMBULATORY_CARE_PROVIDER_SITE_OTHER): Payer: BC Managed Care – PPO | Admitting: Obstetrics and Gynecology

## 2018-10-19 ENCOUNTER — Other Ambulatory Visit: Payer: Self-pay

## 2018-10-19 ENCOUNTER — Encounter: Payer: Self-pay | Admitting: Obstetrics and Gynecology

## 2018-10-19 ENCOUNTER — Other Ambulatory Visit: Payer: Self-pay | Admitting: Obstetrics and Gynecology

## 2018-10-19 VITALS — BP 128/90 | Ht 67.0 in | Wt 178.8 lb

## 2018-10-19 DIAGNOSIS — R61 Generalized hyperhidrosis: Secondary | ICD-10-CM

## 2018-10-19 DIAGNOSIS — Z01419 Encounter for gynecological examination (general) (routine) without abnormal findings: Secondary | ICD-10-CM

## 2018-10-19 DIAGNOSIS — Z1239 Encounter for other screening for malignant neoplasm of breast: Secondary | ICD-10-CM

## 2018-10-19 DIAGNOSIS — M79601 Pain in right arm: Secondary | ICD-10-CM

## 2018-10-19 DIAGNOSIS — Z3041 Encounter for surveillance of contraceptive pills: Secondary | ICD-10-CM

## 2018-10-19 DIAGNOSIS — F4321 Adjustment disorder with depressed mood: Secondary | ICD-10-CM

## 2018-10-19 DIAGNOSIS — M79602 Pain in left arm: Secondary | ICD-10-CM

## 2018-10-19 MED ORDER — SERTRALINE HCL 50 MG PO TABS: 50.0000 mg | ORAL_TABLET | Freq: Every day | ORAL | 3 refills | Status: DC

## 2018-10-19 MED ORDER — LEVONORGESTREL-ETHINYL ESTRAD 0.1-20 MG-MCG PO TABS: ORAL_TABLET | ORAL | 3 refills | Status: DC

## 2018-10-25 LAB — CBC WITH DIFFERENTIAL/PLATELET
Basophils Absolute: 0 10*3/uL (ref 0.0–0.2)
Basos: 1 %
EOS (ABSOLUTE): 0.7 10*3/uL — ABNORMAL HIGH (ref 0.0–0.4)
Eos: 10 %
Hematocrit: 38.3 % (ref 34.0–46.6)
Hemoglobin: 13.1 g/dL (ref 11.1–15.9)
Immature Grans (Abs): 0 10*3/uL (ref 0.0–0.1)
Immature Granulocytes: 0 %
Lymphocytes Absolute: 1.6 10*3/uL (ref 0.7–3.1)
Lymphs: 24 %
MCH: 32 pg (ref 26.6–33.0)
MCHC: 34.2 g/dL (ref 31.5–35.7)
MCV: 93 fL (ref 79–97)
Monocytes Absolute: 0.6 10*3/uL (ref 0.1–0.9)
Monocytes: 9 %
Neutrophils Absolute: 3.6 10*3/uL (ref 1.4–7.0)
Neutrophils: 56 %
Platelets: 249 10*3/uL (ref 150–450)
RBC: 4.1 x10E6/uL (ref 3.77–5.28)
RDW: 11.9 % (ref 11.7–15.4)
WBC: 6.6 10*3/uL (ref 3.4–10.8)

## 2018-10-25 LAB — COMPREHENSIVE METABOLIC PANEL
ALT: 23 IU/L (ref 0–32)
AST: 25 IU/L (ref 0–40)
Albumin/Globulin Ratio: 1.5 (ref 1.2–2.2)
Albumin: 4.4 g/dL (ref 3.8–4.8)
Alkaline Phosphatase: 72 IU/L (ref 39–117)
BUN/Creatinine Ratio: 17 (ref 9–23)
BUN: 13 mg/dL (ref 6–24)
Bilirubin Total: 0.3 mg/dL (ref 0.0–1.2)
CO2: 21 mmol/L (ref 20–29)
Calcium: 8.9 mg/dL (ref 8.7–10.2)
Chloride: 103 mmol/L (ref 96–106)
Creatinine, Ser: 0.78 mg/dL (ref 0.57–1.00)
GFR calc Af Amer: 107 mL/min/{1.73_m2} (ref >59)
GFR calc non Af Amer: 93 mL/min/{1.73_m2} (ref >59)
Globulin, Total: 2.9 g/dL (ref 1.5–4.5)
Glucose: 95 mg/dL (ref 65–99)
Potassium: 4 mmol/L (ref 3.5–5.2)
Sodium: 138 mmol/L (ref 134–144)
Total Protein: 7.3 g/dL (ref 6.0–8.5)

## 2018-10-25 LAB — QUANTIFERON-TB GOLD PLUS
QuantiFERON Mitogen Value: >10 IU/mL
QuantiFERON Nil Value: 0.02 IU/mL
QuantiFERON TB1 Ag Value: 0.02 IU/mL
QuantiFERON TB2 Ag Value: 0.02 IU/mL
QuantiFERON-TB Gold Plus: NEGATIVE

## 2018-10-25 LAB — TSH+FREE T4
Free T4: 1.05 ng/dL (ref 0.82–1.77)
TSH: 0.833 u[IU]/mL (ref 0.450–4.500)

## 2018-10-25 LAB — HIV ANTIBODY (ROUTINE TESTING W REFLEX): HIV Screen 4th Generation wRfx: NONREACTIVE

## 2018-11-16 ENCOUNTER — Other Ambulatory Visit: Payer: Self-pay | Admitting: Obstetrics and Gynecology

## 2018-11-16 DIAGNOSIS — Z1231 Encounter for screening mammogram for malignant neoplasm of breast: Secondary | ICD-10-CM

## 2018-12-12 ENCOUNTER — Other Ambulatory Visit: Payer: Self-pay | Admitting: Obstetrics and Gynecology

## 2018-12-12 ENCOUNTER — Encounter: Payer: Self-pay | Admitting: Obstetrics and Gynecology

## 2018-12-12 MED ORDER — ONDANSETRON 4 MG PO TBDP: 4.0000 mg | ORAL_TABLET | Freq: Four times a day (QID) | ORAL | 0 refills | Status: DC | PRN

## 2018-12-12 NOTE — Progress Notes (Signed)
Rx zofran for nausea with anxiety. F/u prn.

## 2018-12-25 ENCOUNTER — Ambulatory Visit
Admission: RE | Admit: 2018-12-25 | Discharge: 2018-12-25 | Disposition: A | Discharge status: Home / Self Care | Payer: BC Managed Care – PPO | Source: Ambulatory Visit | Attending: Obstetrics and Gynecology | Admitting: Obstetrics and Gynecology

## 2018-12-25 ENCOUNTER — Other Ambulatory Visit: Payer: Self-pay

## 2018-12-25 DIAGNOSIS — Z1231 Encounter for screening mammogram for malignant neoplasm of breast: Secondary | ICD-10-CM | POA: Diagnosis not present

## 2018-12-26 ENCOUNTER — Encounter: Payer: Self-pay | Admitting: Obstetrics and Gynecology

## 2019-07-11 ENCOUNTER — Other Ambulatory Visit: Payer: Self-pay | Admitting: Obstetrics and Gynecology

## 2019-07-11 DIAGNOSIS — Z3041 Encounter for surveillance of contraceptive pills: Secondary | ICD-10-CM

## 2019-08-06 ENCOUNTER — Other Ambulatory Visit: Payer: Self-pay

## 2019-08-06 ENCOUNTER — Encounter: Payer: Self-pay | Admitting: Obstetrics and Gynecology

## 2019-08-09 ENCOUNTER — Ambulatory Visit: Payer: BC Managed Care – PPO | Admitting: Nurse Practitioner

## 2019-08-09 ENCOUNTER — Other Ambulatory Visit: Payer: Self-pay

## 2019-08-09 ENCOUNTER — Encounter: Payer: Self-pay | Admitting: Nurse Practitioner

## 2019-08-09 VITALS — BP 120/80 | HR 75 | Temp 97.0°F | Ht 67.0 in | Wt 191.2 lb

## 2019-08-09 DIAGNOSIS — F419 Anxiety disorder, unspecified: Secondary | ICD-10-CM

## 2019-08-09 DIAGNOSIS — R519 Headache, unspecified: Secondary | ICD-10-CM

## 2019-08-09 DIAGNOSIS — I10 Essential (primary) hypertension: Secondary | ICD-10-CM

## 2019-08-09 DIAGNOSIS — E663 Overweight: Secondary | ICD-10-CM

## 2019-08-09 DIAGNOSIS — R030 Elevated blood-pressure reading, without diagnosis of hypertension: Secondary | ICD-10-CM

## 2019-08-09 DIAGNOSIS — E030 Congenital hypothyroidism with diffuse goiter: Secondary | ICD-10-CM

## 2019-08-09 DIAGNOSIS — G8929 Other chronic pain: Secondary | ICD-10-CM

## 2019-08-09 DIAGNOSIS — M545 Low back pain, unspecified: Secondary | ICD-10-CM | POA: Insufficient documentation

## 2019-08-09 DIAGNOSIS — F4321 Adjustment disorder with depressed mood: Secondary | ICD-10-CM

## 2019-08-09 HISTORY — DX: Low back pain, unspecified: M54.50

## 2019-08-09 HISTORY — DX: Essential (primary) hypertension: I10

## 2019-08-09 HISTORY — DX: Overweight: E66.3

## 2019-08-09 LAB — CBC WITH DIFFERENTIAL/PLATELET
Basophils Absolute: 0.1 10*3/uL (ref 0.0–0.1)
Basophils Relative: 0.9 % (ref 0.0–3.0)
Eosinophils Absolute: 0.5 10*3/uL (ref 0.0–0.7)
Eosinophils Relative: 9.1 % — ABNORMAL HIGH (ref 0.0–5.0)
HCT: 38 % (ref 36.0–46.0)
Hemoglobin: 12.9 g/dL (ref 12.0–15.0)
Lymphocytes Relative: 35.7 % (ref 12.0–46.0)
Lymphs Abs: 2.1 10*3/uL (ref 0.7–4.0)
MCHC: 34.1 g/dL (ref 30.0–36.0)
MCV: 93.3 fl (ref 78.0–100.0)
Monocytes Absolute: 0.6 10*3/uL (ref 0.1–1.0)
Monocytes Relative: 9.8 % (ref 3.0–12.0)
Neutro Abs: 2.7 10*3/uL (ref 1.4–7.7)
Neutrophils Relative %: 44.5 % (ref 43.0–77.0)
Platelets: 281 10*3/uL (ref 150.0–400.0)
RBC: 4.07 Mil/uL (ref 3.87–5.11)
RDW: 12.8 % (ref 11.5–15.5)
WBC: 6 10*3/uL (ref 4.0–10.5)

## 2019-08-09 LAB — COMPREHENSIVE METABOLIC PANEL
ALT: 19 U/L (ref 0–35)
AST: 22 U/L (ref 0–37)
Albumin: 4.1 g/dL (ref 3.5–5.2)
Alkaline Phosphatase: 62 U/L (ref 39–117)
BUN: 11 mg/dL (ref 6–23)
CO2: 27 mEq/L (ref 19–32)
Calcium: 8.8 mg/dL (ref 8.4–10.5)
Chloride: 104 mEq/L (ref 96–112)
Creatinine, Ser: 0.74 mg/dL (ref 0.40–1.20)
GFR: 84.61 mL/min (ref >60.00)
Glucose, Bld: 80 mg/dL (ref 70–99)
Potassium: 3.6 mEq/L (ref 3.5–5.1)
Sodium: 136 mEq/L (ref 135–145)
Total Bilirubin: 0.5 mg/dL (ref 0.2–1.2)
Total Protein: 7.5 g/dL (ref 6.0–8.3)

## 2019-08-09 LAB — LIPID PANEL
Cholesterol: 171 mg/dL (ref 0–200)
HDL: 42.5 mg/dL (ref >39.00)
LDL Cholesterol: 105 mg/dL — ABNORMAL HIGH (ref 0–99)
NonHDL: 128.29
Total CHOL/HDL Ratio: 4
Triglycerides: 116 mg/dL (ref 0.0–149.0)
VLDL: 23.2 mg/dL (ref 0.0–40.0)

## 2019-08-09 LAB — TSH: TSH: 0.84 u[IU]/mL (ref 0.35–4.50)

## 2019-08-09 LAB — HEMOGLOBIN A1C: Hgb A1c MFr Bld: 5.2 % (ref 4.6–6.5)

## 2019-08-09 MED ORDER — SERTRALINE HCL 50 MG PO TABS: 100.0000 mg | ORAL_TABLET | Freq: Every day | ORAL | 0 refills | Status: DC

## 2019-08-09 NOTE — Assessment & Plan Note (Signed)
She bring  in BP readings from home 155/107 ranges and reports the cuff size is normal. Past records show stage 1 HTN. She has gained Wt, gets frequent HA that she attributes to HTN. She also takes daily Excedrin x 2 per day over the last 3 years for chronic low back pain.

## 2019-08-09 NOTE — Progress Notes (Signed)
_New Patient Office Visit  Subjective:  Patient ID: Lori Dawson, female    DOB: 23-Jul-1975  Age: 44 y.o. MRN: [id number]  CC:  Chief Complaint  Patient presents with  . Establish Care    elevated BP    HPI Lori Dawson presents to establish care with primary care. Had her first Covid vaccine.   1. HTN:  She has been checking her BP at home and notes 154/107, 159/ 90-105. She wants start a BP med that helps her lose wt. She had migraines in the past and saw Neurologist and she could not take the nortriptyline.  HA 2-3 times a week- and she thinks her elevated BP is causing the HA.   BP Readings from Last 3 Encounters:  08/09/19 120/80  10/19/18 128/90  12/29/17 120/80    2. Back pain/whole body aches form time to time. She has lumbar herniated disc and 2 cortisone shots in the past- and her [facility] specialist left in 2018. She takes daily Excedrin x 2 tabs every morning for back issues and  whole body hurts. She will take occasional  Ibuprofen. She has been taking these NSAIDs daily for 3 years. In addition, if she wakes in the middle of the night, she will take acetaminophen 1 gm- plain and that helps her go back to sleep.   3.  Wt gain over 2 years and struggling to get that off. She is on a low carb diet.  Wt Readings from Last 3 Encounters:  08/09/19 191 lb 3.2 oz (86.7 kg)  10/19/18 178 lb 12.8 oz (81.1 kg)  12/29/17 166 lb (75.3 kg)   3. Anxiety: She started on Zoloft last year after brother died suddenly and it helping. She thinks it is working fair, but would like to increase. Today PHQ-9 is score of 10, no suicidal thoughts "at all". GAD-8. She is feeling less anxious. Declines psychiatry at this time.   Past Medical History:  Diagnosis Date  . Allergy    seasonal  . 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 Herniated lumbar intervertebral disc   . HTN (hypertension) 08/09/2019  . Low back pain 08/09/2019  . Migraine headache   . Overweight (BMI 25.0-29.9)  08/09/2019    Past Surgical History:  Procedure Laterality Date  . AUGMENTATION MAMMAPLASTY  2004   in [place], right breast was larger  . BREAST BIOPSY Left 11/18/2016   core bx Byrnett, neg  . TUBAL LIGATION  2006    Family History  Problem Relation Age of Onset  . Cervical cancer Maternal Aunt   . Diabetes Paternal Grandmother   . Lung cancer Paternal Grandmother   . Lung cancer Paternal Grandfather   . Thyroid disease Mother   . Hypertension Father   . Hyperlipidemia Father   . Heart attack Brother 65  . Coronary artery disease Brother   . Coronary artery disease Maternal Grandfather   . Breast cancer Neg Hx     Social History   Socioeconomic History  . Marital status: Married    Spouse name: Not on file  . Number of children: Not on file  . Years of education: Not on file  . Highest education level: Not on file  Occupational History  . Not on file  Tobacco Use  . Smoking status: Never Smoker  . Smokeless tobacco: Never Used  Substance and Sexual Activity  . Alcohol use: Yes    Alcohol/week: 3.0 standard drinks    Types: 3 Glasses of  wine per week  . Drug use: Never  . Sexual activity: Yes    Birth control/protection: Surgical    Comment: Tubal ligation   Other Topics Concern  . Not on file  Social History Narrative    Married 22 years. 2 girls- college and DTR in grad school. 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   Social Determinants of Health   Financial Resource Strain:   . Difficulty of Paying Living Expenses:   Food Insecurity:   . Worried About Charity fundraiser in the Last Year:   . Arboriculturist in the Last Year:   Transportation Needs:   . Film/video editor (Medical):   Lori Dawson Lack of Transportation (Non-Medical):   Physical Activity:   . Days of Exercise per Week:   . Minutes of Exercise per Session:   Stress:   . Feeling of Stress :   Social Connections:   . Frequency of Communication with Friends and Family:   .  Frequency of Social Gatherings with Friends and Family:   . Attends Religious Services:   . Active Member of Clubs or Organizations:   . Attends Archivist Meetings:   Lori Dawson Marital Status:   Intimate Partner Violence:   . Fear of Current or Ex-Partner:   . Emotionally Abused:   Lori Dawson Physically Abused:   . Sexually Abused:     Review of Systems  Constitutional: Negative for chills, fatigue and fever.  HENT: Negative for congestion.   Respiratory: Negative for cough and shortness of breath.   Cardiovascular: Negative for chest pain, palpitations and leg swelling.  Gastrointestinal: Negative for abdominal pain.  Genitourinary: Negative for dysuria.  Musculoskeletal: Positive for arthralgias and back pain.  Allergic/Immunologic: Positive for environmental allergies.  Neurological: Negative for dizziness, weakness and numbness.  Hematological: Negative for adenopathy. Does not bruise/bleed easily.  Psychiatric/Behavioral:       Denies suicidal or homicidal ideation.See HPI     Objective:   Today's Vitals: BP 120/80   Pulse 75   Temp (!) 97 F (36.1 C) (Temporal)   Ht 5' 7"  (1.702 m)   Wt 191 lb 3.2 oz (86.7 kg)   LMP 06/25/2019 (Exact Date)   SpO2 97%   BMI 29.95 kg/m   Physical Exam Constitutional:      Appearance: Normal appearance.  HENT:     Head: Normocephalic and atraumatic.  Eyes:     Pupils: Pupils are equal, round, and reactive to light.  Cardiovascular:     Rate and Rhythm: Normal rate and regular rhythm.     Pulses: Normal pulses.     Heart sounds: Normal heart sounds.  Pulmonary:     Effort: Pulmonary effort is normal.     Breath sounds: Normal breath sounds.  Abdominal:     Palpations: Abdomen is soft.     Tenderness: There is no abdominal tenderness.  Musculoskeletal:        General: No swelling. Normal range of motion.     Cervical back: Normal range of motion and neck supple.  Skin:    General: Skin is warm and dry.  Neurological:      General: No focal deficit present.     Mental Status: She is alert and oriented to person, place, and time.  Psychiatric:        Behavior: Behavior normal.        Thought Content: Thought content normal.        Judgment: Judgment  normal.     Comments: See HPI. Becomes tearful when talking about poor sleep, back pain and loss of her brother.      Assessment & Plan:   HTN: Please check your BP after sitting and rest for 5 min- check the cuff size and location. Please record and bring in the readings and actual BP cuff to your next visit. We are not getting those extremely high BP readings in the clinic per chart review.  Obesity: Dash diet as noted below. Wt loss- gradual . She is concerned with weight gain and she has gained weight while on Zoloft.  Anxiety/Depression: She would like to increase her Zoloft to 100 mg daily. I recommend counseling-she declines. She is concerned with weight gain and she has gained weight while on Zoloft.  I will increase her Zoloft for a short time and will d/w Dr. Nicki Dawson as the addition of Wellbutrin may be more  helpful for depression than doubling her Zoloft.  Begin walking 20-30 daily. She reports that she is safe at home.   Low back pain: Excedrin and ibuprofen are likely increasing her BP at home and can be causing  re- bound HA. We discussed this today. Referral placed to Dr. Sharlet Salina and weaning off NSAID's to Tylenol .Trial of stretching and exercises safe for her condition.   Please go to the lab. We will call or My Chart results with further recommendations about BP management.   Outpatient Encounter Medications as of 08/09/2019  Medication Sig  . cetirizine (ZYRTEC) 10 MG tablet Take 10 mg by mouth daily.  Lori Dawson levonorgestrel-ethinyl estradiol (LARISSIA) 0.1-20 MG-MCG tablet TAKE 1 TABLET BY MOUTH EVERY DAY CONTINUOUS DOSING  . montelukast (SINGULAIR) 10 MG tablet   . Olopatadine HCl 0.2 % SOLN   . ondansetron (ZOFRAN ODT) 4 MG disintegrating tablet  Take 1 tablet (4 mg total) by mouth every 6 (six) hours as needed for nausea.  . sertraline (ZOLOFT) 50 MG tablet Take 2 tablets (100 mg total) by mouth daily.  . [DISCONTINUED] ibuprofen (ADVIL,MOTRIN) 400 MG tablet Take 400 mg by mouth as needed.  . [DISCONTINUED] sertraline (ZOLOFT) 50 MG tablet Take 1 tablet (50 mg total) by mouth daily.   No facility-administered encounter medications on file as of 08/09/2019.    Follow-up: Return in about 3 weeks (around 08/30/2019).   This visit occurred during the SARS-CoV-2 public health emergency.  Safety protocols were in place, including screening questions prior to the visit, additional usage of staff PPE, and extensive cleaning of exam room while observing appropriate contact time as indicated for disinfecting solutions.   Denice Paradise, NP

## 2019-08-09 NOTE — Assessment & Plan Note (Signed)
She has a known HX of lumber herniated disc and 2 cortisone shots in the past- and her Dr left in [place] in 2018. She takes daily Excedrin that may be increasing her BP. Referral to Dr. Sharlet Salina and weaning  off NSAIDs to Tylenol yoga- check regarding which exercises are safe for her.

## 2019-08-09 NOTE — Assessment & Plan Note (Addendum)
She is feeling less anxious, but wake up at 0100 and can't sleep Becomes tearful when talking about stress PHQ-9 is elevated. She would like to increase the Zoloft. Declines Psychiatry at this time . Not suicidal or homicidals.   GAD -8 and PHQ-9 scores10

## 2019-08-09 NOTE — Patient Instructions (Addendum)
It was very nice to meet you today.   Please go to the lab. We will call or My Chart results with further recommendations about BP management.   Please check your BP after sitting and rest for 5 min- check the cuff size and location. Please record and bring in the readings and actual BP cuff to your next visit.   Dash diet as noted below. Wt loss- gradual .   Increase your Zoloft to 100 mg daily. Call if having any problems. Stop the NSAIDs, Exercise. Mindfulness. Consider counseling.   See Dr. Sharlet Salina to help yo get off of the NSAIDs that is likely contributing to your elevated BP. They can also give re bound headaches. Use acetaminophen Tylenol  as directed.   Ask Dr. Sharlet Salina about stretching exercises. Begin walking 20-30 daily.       DASH Eating Plan DASH stands for "Dietary Approaches to Stop Hypertension." The DASH eating plan is a healthy eating plan that has been shown to reduce high blood pressure (hypertension). It may also reduce your risk for type 2 diabetes, heart disease, and stroke. The DASH eating plan may also help with weight loss. What are tips for following this plan?  General guidelines  Avoid eating more than 2,300 mg (milligrams) of salt (sodium) a day. If you have hypertension, you may need to reduce your sodium intake to 1,500 mg a day.  Limit alcohol intake to no more than 1 drink a day for nonpregnant women and 2 drinks a day for men. One drink equals 12 oz of beer, 5 oz of wine, or 1 oz of hard liquor.  Work with your health care provider to maintain a healthy body weight or to lose weight. Ask what an ideal weight is for you.  Get at least 30 minutes of exercise that causes your heart to beat faster (aerobic exercise) most days of the week. Activities may include walking, swimming, or biking.  Work with your health care provider or diet and nutrition specialist (dietitian) to adjust your eating plan to your individual calorie needs. Reading food  labels   Check food labels for the amount of sodium per serving. Choose foods with less than 5 percent of the Daily Value of sodium. Generally, foods with less than 300 mg of sodium per serving fit into this eating plan.  To find whole grains, look for the word "whole" as the first word in the ingredient list. Shopping  Buy products labeled as "low-sodium" or "no salt added."  Buy fresh foods. Avoid canned foods and premade or frozen meals. Cooking  Avoid adding salt when cooking. Use salt-free seasonings or herbs instead of table salt or sea salt. Check with your health care provider or pharmacist before using salt substitutes.  Do not fry foods. Cook foods using healthy methods such as baking, boiling, grilling, and broiling instead.  Cook with heart-healthy oils, such as olive, canola, soybean, or sunflower oil. Meal planning  Eat a balanced diet that includes: ? 5 or more servings of fruits and vegetables each day. At each meal, try to fill half of your plate with fruits and vegetables. ? Up to 6-8 servings of whole grains each day. ? Less than 6 oz of lean meat, poultry, or fish each day. A 3-oz serving of meat is about the same size as a deck of cards. One egg equals 1 oz. ? 2 servings of low-fat dairy each day. ? A serving of nuts, seeds, or beans 5 times each  week. ? Heart-healthy fats. Healthy fats called Omega-3 fatty acids are found in foods such as flaxseeds and coldwater fish, like sardines, salmon, and mackerel.  Limit how much you eat of the following: ? Canned or prepackaged foods. ? Food that is high in trans fat, such as fried foods. ? Food that is high in saturated fat, such as fatty meat. ? Sweets, desserts, sugary drinks, and other foods with added sugar. ? Full-fat dairy products.  Do not salt foods before eating.  Try to eat at least 2 vegetarian meals each week.  Eat more home-cooked food and less restaurant, buffet, and fast food.  When eating at a  restaurant, ask that your food be prepared with less salt or no salt, if possible. What foods are recommended? The items listed may not be a complete list. Talk with your dietitian about what dietary choices are best for you. Grains Whole-grain or whole-wheat bread. Whole-grain or whole-wheat pasta. Brown rice. [place]. Bulgur. Whole-grain and low-sodium cereals. Pita bread. Low-fat, low-sodium crackers. Whole-wheat flour tortillas. Vegetables Fresh or frozen vegetables (raw, steamed, roasted, or grilled). Low-sodium or reduced-sodium tomato and vegetable juice. Low-sodium or reduced-sodium tomato sauce and tomato paste. Low-sodium or reduced-sodium canned vegetables. Fruits All fresh, dried, or frozen fruit. Canned fruit in natural juice (without added sugar). Meat and other protein foods Skinless chicken or [place]. Ground chicken or [place]. Pork with fat trimmed off. Fish and seafood. Egg whites. Dried beans, peas, or lentils. Unsalted nuts, nut butters, and seeds. Unsalted canned beans. Lean cuts of beef with fat trimmed off. Low-sodium, lean deli meat. Dairy Low-fat (1%) or fat-free (skim) milk. Fat-free, low-fat, or reduced-fat cheeses. Nonfat, low-sodium ricotta or cottage cheese. Low-fat or nonfat yogurt. Low-fat, low-sodium cheese. Fats and oils Soft margarine without trans fats. Vegetable oil. Low-fat, reduced-fat, or light mayonnaise and salad dressings (reduced-sodium). Canola, safflower, olive, soybean, and sunflower oils. Avocado. Seasoning and other foods Herbs. Spices. Seasoning mixes without salt. Unsalted popcorn and pretzels. Fat-free sweets. What foods are not recommended? The items listed may not be a complete list. Talk with your dietitian about what dietary choices are best for you. Grains Baked goods made with fat, such as croissants, muffins, or some breads. Dry pasta or rice meal packs. Vegetables Creamed or fried vegetables. Vegetables in a cheese sauce.  Regular canned vegetables (not low-sodium or reduced-sodium). Regular canned tomato sauce and paste (not low-sodium or reduced-sodium). Regular tomato and vegetable juice (not low-sodium or reduced-sodium). Angie Fava. Olives. Fruits Canned fruit in a light or heavy syrup. Fried fruit. Fruit in cream or butter sauce. Meat and other protein foods Fatty cuts of meat. Ribs. Fried meat. Berniece Salines. Sausage. Bologna and other processed lunch meats. Salami. Fatback. Hotdogs. Bratwurst. Salted nuts and seeds. Canned beans with added salt. Canned or smoked fish. Whole eggs or egg yolks. Chicken or [place] with skin. Dairy Whole or 2% milk, cream, and half-and-half. Whole or full-fat cream cheese. Whole-fat or sweetened yogurt. Full-fat cheese. Nondairy creamers. Whipped toppings. Processed cheese and cheese spreads. Fats and oils Butter. Stick margarine. Lard. Shortening. Ghee. Bacon fat. Tropical oils, such as coconut, palm kernel, or palm oil. Seasoning and other foods Salted popcorn and pretzels. Onion salt, garlic salt, seasoned salt, table salt, and sea salt. Worcestershire sauce. Tartar sauce. Barbecue sauce. Teriyaki sauce. Soy sauce, including reduced-sodium. Steak sauce. Canned and packaged gravies. Fish sauce. Oyster sauce. Cocktail sauce. Horseradish that you find on the shelf. Ketchup. Mustard. Meat flavorings and tenderizers. Bouillon cubes. Hot sauce and  Tabasco sauce. Premade or packaged marinades. Premade or packaged taco seasonings. Relishes. Regular salad dressings. Where to find more information:  National Heart, Lung, and [place]: https://wilson-eaton.com/  American Heart Association: www.heart.org Summary  The DASH eating plan is a healthy eating plan that has been shown to reduce high blood pressure (hypertension). It may also reduce your risk for type 2 diabetes, heart disease, and stroke.  With the DASH eating plan, you should limit salt (sodium) intake to 2,300 mg a day. If you have  hypertension, you may need to reduce your sodium intake to 1,500 mg a day.  When on the DASH eating plan, aim to eat more fresh fruits and vegetables, whole grains, lean proteins, low-fat dairy, and heart-healthy fats.  Work with your health care provider or diet and nutrition specialist (dietitian) to adjust your eating plan to your individual calorie needs. This information is not intended to replace advice given to you by your health care provider. Make sure you discuss any questions you have with your health care provider. Document Revised: 04/15/2017 Document Reviewed: 04/26/2016 Elsevier Patient Education  2020 [employer].

## 2019-08-10 ENCOUNTER — Encounter: Payer: Self-pay | Admitting: Nurse Practitioner

## 2019-08-30 ENCOUNTER — Encounter: Payer: Self-pay | Admitting: Nurse Practitioner

## 2019-08-30 ENCOUNTER — Telehealth: Payer: BC Managed Care – PPO | Admitting: Nurse Practitioner

## 2019-08-30 ENCOUNTER — Telehealth: Payer: Self-pay | Admitting: Nurse Practitioner

## 2019-08-30 VITALS — BP 137/96 | Ht 67.0 in | Wt 191.0 lb

## 2019-08-30 DIAGNOSIS — F329 Major depressive disorder, single episode, unspecified: Secondary | ICD-10-CM

## 2019-08-30 DIAGNOSIS — J3089 Other allergic rhinitis: Secondary | ICD-10-CM

## 2019-08-30 DIAGNOSIS — I10 Essential (primary) hypertension: Secondary | ICD-10-CM

## 2019-08-30 DIAGNOSIS — E663 Overweight: Secondary | ICD-10-CM

## 2019-08-30 DIAGNOSIS — F32A Depression, unspecified: Secondary | ICD-10-CM

## 2019-08-30 DIAGNOSIS — R419 Unspecified symptoms and signs involving cognitive functions and awareness: Secondary | ICD-10-CM

## 2019-08-30 DIAGNOSIS — F419 Anxiety disorder, unspecified: Secondary | ICD-10-CM

## 2019-08-30 MED ORDER — HYDROCHLOROTHIAZIDE 12.5 MG PO CAPS: 12.5000 mg | ORAL_CAPSULE | Freq: Every day | ORAL | 0 refills | Status: DC

## 2019-08-30 NOTE — Progress Notes (Signed)
Virtual Visit via Video Note  I connected with@ on 08/30/19 at  3:00 PM EDT by a video enabled telemedicine application and verified that I am speaking with the correct person using two identifiers. This visit type was conducted due to national recommendations for restrictions regarding the COVID-19 Pandemic (e.g. social distancing).  This format is felt to be most appropriate for this patient at this time.   I discussed the limitations of evaluation and management by telemedicine and the availability of in person appointments. The patient expressed understanding and agreed to proceed.  Only the patient and myself were on today's video visit. The patient was at home and I was in my office at the time of today's visit.  We had connection issues and had a convert to a telephone call for the visit.  History of Present Illness: The patient presents today for follow-up of hypertension and anxiety/ depression.  She established care 08/09/2019 and was reporting blood pressure at home and notes 154/107, 159/ 90-105.  The in-office blood pressure was 120/80.  She was also feeling anxious on Zoloft 50 mg.  She was also having low back pain.  Patient was advised to monitor her blood pressure at home and we would evaluate her home blood pressure readings after treating her anxiety with increase Zoloft to 100 mg daily.  She was advised to cut back on NSAID use as that could elevate the blood pressure as well.  Anxiety/Depression: She presents today on Zoloft 100 mg daily.  She says she is definitely improved.  She is not sleeping as much as she had been.  She does not have that lost feeling like she had.  Her weight was 160 pounds before she started on Zoloft.  Her weight has remained stable over the last month.  We talked about possible weight gain with this medication.  She tried Wellbutrin in the past but had hallucinations with that.    Overweight : BMI 29.91 Steadily gained weight over time. She was 160 lbs  before she started on Zoloft.  Wt Readings from Last 3 Encounters:  08/30/19 191 lb (86.6 kg)  08/09/19 191 lb 3.2 oz (86.7 kg)  10/19/18 178 lb 12.8 oz (81.1 kg)    HTN: She has been monitoring her blood pressure at home and definitely finds that it is running 130-140/80-90.  She has been watching her blood pressure for over a year and it is often 140/84.  She feels like her fingers swell, and she retains fluid.  She is requesting to start on BP medication.   Back pain-chronic and HA:  She is avoiding taking Excedrin only when needed not routinely daily.  She is having less headaches now that she is on Zyrtec for allergies.  CBC w Eos 9.1    Observations/Objective: VOZ:DGUYQI awake, alert, no acute distress Resp: Breathing is even and non-labored Psych: calm/pleasant demeanor Neuro: Alert and Oriented x 3, thought processes normal    Assessment and Plan:  HTN: Begin HCTZ 12.5 mg daily. Bmet in 2 weeks for monitoring of potassium. Counseled re risk of low K and asked to check BP at home and record. Avoid NSAID's Wt loss - healthy diet and increased activity DASH diet- eat a diet rich in potassium Stress management OV in 3 mos  Anxiety/Depression: She presents today on Zoloft 100 mg daily.  She says she is definitely improved.  She is not sleeping as much as she had been.  She does not have that lost feeling like  she had.  Her weight was 160 pounds before she started on Zoloft.  She had loss of her brother, grief and over eating. Her weight has remained stable over the last month.  We talked about possible weight gain with this medication.  She tried Wellbutrin in the past but had hallucinations with that test and had come off the medication.  Plan to continue on current Zoloft 100 mg  dose, monitor for weight gain. Will need to avoid hypokalemia on HCTZ to reduce risk of arrhythmia. She had normal QT interval 0.39 on 2019 EKG.  She declined psychiatry consultation in the past.  Office  visit in 3 months and will repeat PHQ 9.  Environmental and seasonal allergies: She presents on Zyrtec which she takes as needed for allergies.  When she takes Zyrtec, it helps and she has less headaches.  She reported eyes and allergy complaints at this time.  Preventative healthcare CBC showed a mild elevation in eosinophils at 9.1. We are in peak pollen season now.  This can be rechecked after allergy season in 3 mos.   Follow Up Instructions:   I discussed the assessment and treatment plan with the patient. The patient was provided an opportunity to ask questions and all were answered. The patient agreed with the plan and demonstrated an understanding of the instructions.   The patient was advised to call back or seek an in-person evaluation if the symptoms worsen or if the condition fails to improve as anticipated.    Denice Paradise, NP

## 2019-08-30 NOTE — Telephone Encounter (Signed)
Lvm to schedule 2 week follow up with Dawson Bills.

## 2019-09-01 ENCOUNTER — Encounter: Payer: Self-pay | Admitting: Nurse Practitioner

## 2019-09-01 DIAGNOSIS — J3089 Other allergic rhinitis: Secondary | ICD-10-CM

## 2019-09-01 HISTORY — DX: Other allergic rhinitis: J30.89

## 2019-09-01 NOTE — Assessment & Plan Note (Signed)
She presents on Zyrtec which she takes as needed for allergies.  When she takes Zyrtec, it helps and she has less headaches.  She reported eyes and allergy complaints at this time.  Preventative healthcare CBC showed a mild elevation in eosinophils at 9.1. We are in peak pollen season now.  This can be rechecked after allergy season in 3 mos.

## 2019-09-01 NOTE — Assessment & Plan Note (Signed)
Begin HCTZ 12.5 mg daily. Bmet in 2 weeks for monitoring of potassium. Counseled re risk of low K and asked to check BP at home and record. Avoid NSAIDs Wt loss - healthy diet and increased activity DASH diet- eat a diet rich in potassium Stress management OV in 3 mos

## 2019-09-01 NOTE — Assessment & Plan Note (Addendum)
She presents today on Zoloft 100 mg daily.  She says she is definitely improved.  She is not sleeping as much as she had been.  She does not have that lost feeling like she had.  Her weight was 160 pounds before she started on Zoloft.  She had loss of her brother, grief and over eating. Her weight has remained stable over the last month.  We talked about possible weight gain with this medication.  She tried Wellbutrin in the past but had hallucinations with that test and had come off the medication.  Plan to continue on current Zoloft 100 mg  dose, monitor for weight gain. Will need to avoid hypokalemia on HCTZ to reduce risk of arrhythmia. She had normal QT interval 0.39 on 2019 EKG.  She declined psychiatry consultation in the past.  Office visit in 3 months and will repeat PHQ 9.

## 2019-09-01 NOTE — Patient Instructions (Addendum)
Hi Lori Dawson,   Plan to continue on current Zoloft 100 mg  dose, monitor for weight gain. Let me know if it climbs or you are cutting back 500 calories per day and not losing 1 pound per week.   Begin HCTZ 12.5 mg daily. Main side effect is low potasium. Eat a diet rich in potassium: see DASH  Lab check met in 2 weeks for monitoring of potassium. You will need a lab appt and I will ask the staff to set this up.  Please check BP at home and record results. Avoid NSAIDs Wt loss - healthy diet and increased activity DASH diet- eat a diet rich in potassium Stress management OV in 3 mos  I placed an order for CBC in July to re-check your eosinophils that are allergy markers and were mildly elevated on your CBC.  You will need a lab appt and I will ask the staff to set this up.    DASH Eating Plan DASH stands for "Dietary Approaches to Stop Hypertension." The DASH eating plan is a healthy eating plan that has been shown to reduce high blood pressure (hypertension). It may also reduce your risk for type 2 diabetes, heart disease, and stroke. The DASH eating plan may also help with weight loss. What are tips for following this plan?  General guidelines  Avoid eating more than 2,300 mg (milligrams) of salt (sodium) a day. If you have hypertension, you may need to reduce your sodium intake to 1,500 mg a day.  Limit alcohol intake to no more than 1 drink a day for nonpregnant women and 2 drinks a day for men. One drink equals 12 oz of beer, 5 oz of wine, or 1 oz of hard liquor.  Work with your health care provider to maintain a healthy body weight or to lose weight. Ask what an ideal weight is for you.  Get at least 30 minutes of exercise that causes your heart to beat faster (aerobic exercise) most days of the week. Activities may include walking, swimming, or biking.  Work with your health care provider or diet and nutrition specialist (dietitian) to adjust your eating plan to your individual  calorie needs. Reading food labels   Check food labels for the amount of sodium per serving. Choose foods with less than 5 percent of the Daily Value of sodium. Generally, foods with less than 300 mg of sodium per serving fit into this eating plan.  To find whole grains, look for the word "whole" as the first word in the ingredient list. Shopping  Buy products labeled as "low-sodium" or "no salt added."  Buy fresh foods. Avoid canned foods and premade or frozen meals. Cooking  Avoid adding salt when cooking. Use salt-free seasonings or herbs instead of table salt or sea salt. Check with your health care provider or pharmacist before using salt substitutes.  Do not fry foods. Cook foods using healthy methods such as baking, boiling, grilling, and broiling instead.  Cook with heart-healthy oils, such as olive, canola, soybean, or sunflower oil. Meal planning  Eat a balanced diet that includes: ? 5 or more servings of fruits and vegetables each day. At each meal, try to fill half of your plate with fruits and vegetables. ? Up to 6-8 servings of whole grains each day. ? Less than 6 oz of lean meat, poultry, or fish each day. A 3-oz serving of meat is about the same size as a deck of cards. One egg equals 1 oz. ?  2 servings of low-fat dairy each day. ? A serving of nuts, seeds, or beans 5 times each week. ? Heart-healthy fats. Healthy fats called Omega-3 fatty acids are found in foods such as flaxseeds and coldwater fish, like sardines, salmon, and mackerel.  Limit how much you eat of the following: ? Canned or prepackaged foods. ? Food that is high in trans fat, such as fried foods. ? Food that is high in saturated fat, such as fatty meat. ? Sweets, desserts, sugary drinks, and other foods with added sugar. ? Full-fat dairy products.  Do not salt foods before eating.  Try to eat at least 2 vegetarian meals each week.  Eat more home-cooked food and less restaurant, buffet, and fast  food.  When eating at a restaurant, ask that your food be prepared with less salt or no salt, if possible. What foods are recommended? The items listed may not be a complete list. Talk with your dietitian about what dietary choices are best for you. Grains Whole-grain or whole-wheat bread. Whole-grain or whole-wheat pasta. Brown rice. [place]. Bulgur. Whole-grain and low-sodium cereals. Pita bread. Low-fat, low-sodium crackers. Whole-wheat flour tortillas. Vegetables Fresh or frozen vegetables (raw, steamed, roasted, or grilled). Low-sodium or reduced-sodium tomato and vegetable juice. Low-sodium or reduced-sodium tomato sauce and tomato paste. Low-sodium or reduced-sodium canned vegetables. Fruits All fresh, dried, or frozen fruit. Canned fruit in natural juice (without added sugar). Meat and other protein foods Skinless chicken or [place]. Ground chicken or [place]. Pork with fat trimmed off. Fish and seafood. Egg whites. Dried beans, peas, or lentils. Unsalted nuts, nut butters, and seeds. Unsalted canned beans. Lean cuts of beef with fat trimmed off. Low-sodium, lean deli meat. Dairy Low-fat (1%) or fat-free (skim) milk. Fat-free, low-fat, or reduced-fat cheeses. Nonfat, low-sodium ricotta or cottage cheese. Low-fat or nonfat yogurt. Low-fat, low-sodium cheese. Fats and oils Soft margarine without trans fats. Vegetable oil. Low-fat, reduced-fat, or light mayonnaise and salad dressings (reduced-sodium). Canola, safflower, olive, soybean, and sunflower oils. Avocado. Seasoning and other foods Herbs. Spices. Seasoning mixes without salt. Unsalted popcorn and pretzels. Fat-free sweets. What foods are not recommended? The items listed may not be a complete list. Talk with your dietitian about what dietary choices are best for you. Grains Baked goods made with fat, such as croissants, muffins, or some breads. Dry pasta or rice meal packs. Vegetables Creamed or fried vegetables. Vegetables  in a cheese sauce. Regular canned vegetables (not low-sodium or reduced-sodium). Regular canned tomato sauce and paste (not low-sodium or reduced-sodium). Regular tomato and vegetable juice (not low-sodium or reduced-sodium). Angie Fava. Olives. Fruits Canned fruit in a light or heavy syrup. Fried fruit. Fruit in cream or butter sauce. Meat and other protein foods Fatty cuts of meat. Ribs. Fried meat. Berniece Salines. Sausage. Bologna and other processed lunch meats. Salami. Fatback. Hotdogs. Bratwurst. Salted nuts and seeds. Canned beans with added salt. Canned or smoked fish. Whole eggs or egg yolks. Chicken or [place] with skin. Dairy Whole or 2% milk, cream, and half-and-half. Whole or full-fat cream cheese. Whole-fat or sweetened yogurt. Full-fat cheese. Nondairy creamers. Whipped toppings. Processed cheese and cheese spreads. Fats and oils Butter. Stick margarine. Lard. Shortening. Ghee. Bacon fat. Tropical oils, such as coconut, palm kernel, or palm oil. Seasoning and other foods Salted popcorn and pretzels. Onion salt, garlic salt, seasoned salt, table salt, and sea salt. Worcestershire sauce. Tartar sauce. Barbecue sauce. Teriyaki sauce. Soy sauce, including reduced-sodium. Steak sauce. Canned and packaged gravies. Fish sauce. Oyster sauce. Cocktail sauce.  Horseradish that you find on the shelf. Ketchup. Mustard. Meat flavorings and tenderizers. Bouillon cubes. Hot sauce and Tabasco sauce. Premade or packaged marinades. Premade or packaged taco seasonings. Relishes. Regular salad dressings. Where to find more information:  National Heart, Lung, and [place]: https://wilson-eaton.com/  American Heart Association: www.heart.org Summary  The DASH eating plan is a healthy eating plan that has been shown to reduce high blood pressure (hypertension). It may also reduce your risk for type 2 diabetes, heart disease, and stroke.  With the DASH eating plan, you should limit salt (sodium) intake to 2,300 mg a  day. If you have hypertension, you may need to reduce your sodium intake to 1,500 mg a day.  When on the DASH eating plan, aim to eat more fresh fruits and vegetables, whole grains, lean proteins, low-fat dairy, and heart-healthy fats.  Work with your health care provider or diet and nutrition specialist (dietitian) to adjust your eating plan to your individual calorie needs. This information is not intended to replace advice given to you by your health care provider. Make sure you discuss any questions you have with your health care provider. Document Revised: 04/15/2017 Document Reviewed: 04/26/2016 Elsevier Patient Education  2020 [employer].  Managing Your Hypertension Hypertension is commonly called high blood pressure. This is when the force of your blood pressing against the walls of your arteries is too strong. Arteries are blood vessels that carry blood from your heart throughout your body. Hypertension forces the heart to work harder to pump blood, and may cause the arteries to become narrow or stiff. Having untreated or uncontrolled hypertension can cause heart attack, stroke, kidney disease, and other problems. What are blood pressure readings? A blood pressure reading consists of a higher number over a lower number. Ideally, your blood pressure should be below 120/80. The first ("top") number is called the systolic pressure. It is a measure of the pressure in your arteries as your heart beats. The second ("bottom") number is called the diastolic pressure. It is a measure of the pressure in your arteries as the heart relaxes. What does my blood pressure reading mean? Blood pressure is classified into four stages. Based on your blood pressure reading, your health care provider may use the following stages to determine what type of treatment you need, if any. Systolic pressure and diastolic pressure are measured in a unit called mm Hg. Normal  Systolic pressure: below 373.  Diastolic  pressure: below 80. Elevated  Systolic pressure: 428-768.  Diastolic pressure: below 80. Hypertension stage 1  Systolic pressure: 115-726.  Diastolic pressure: 20-35. Hypertension stage 2  Systolic pressure: 597 or above.  Diastolic pressure: 90 or above. What health risks are associated with hypertension? Managing your hypertension is an important responsibility. Uncontrolled hypertension can lead to:  A heart attack.  A stroke.  A weakened blood vessel (aneurysm).  Heart failure.  Kidney damage.  Eye damage.  Metabolic syndrome.  Memory and concentration problems. What changes can I make to manage my hypertension? Hypertension can be managed by making lifestyle changes and possibly by taking medicines. Your health care provider will help you make a plan to bring your blood pressure within a normal range. Eating and drinking   Eat a diet that is high in fiber and potassium, and low in salt (sodium), added sugar, and fat. An example eating plan is called the DASH (Dietary Approaches to Stop Hypertension) diet. To eat this way: ? Eat plenty of fresh fruits and vegetables. Try  to fill half of your plate at each meal with fruits and vegetables. ? Eat whole grains, such as whole wheat pasta, brown rice, or whole grain bread. Fill about one quarter of your plate with whole grains. ? Eat low-fat diary products. ? Avoid fatty cuts of meat, processed or cured meats, and poultry with skin. Fill about one quarter of your plate with lean proteins such as fish, chicken without skin, beans, eggs, and tofu. ? Avoid premade and processed foods. These tend to be higher in sodium, added sugar, and fat.  Reduce your daily sodium intake. Most people with hypertension should eat less than 1,500 mg of sodium a day.  Limit alcohol intake to no more than 1 drink a day for nonpregnant women and 2 drinks a day for men. One drink equals 12 oz of beer, 5 oz of wine, or 1 oz of hard  liquor. Lifestyle  Work with your health care provider to maintain a healthy body weight, or to lose weight. Ask what an ideal weight is for you.  Get at least 30 minutes of exercise that causes your heart to beat faster (aerobic exercise) most days of the week. Activities may include walking, swimming, or biking.  Include exercise to strengthen your muscles (resistance exercise), such as weight lifting, as part of your weekly exercise routine. Try to do these types of exercises for 30 minutes at least 3 days a week.  Do not use any products that contain nicotine or tobacco, such as cigarettes and e-cigarettes. If you need help quitting, ask your health care provider.  Control any long-term (chronic) conditions you have, such as high cholesterol or diabetes. Monitoring  Monitor your blood pressure at home as told by your health care provider. Your personal target blood pressure may vary depending on your medical conditions, your age, and other factors.  Have your blood pressure checked regularly, as often as told by your health care provider. Working with your health care provider  Review all the medicines you take with your health care provider because there may be side effects or interactions.  Talk with your health care provider about your diet, exercise habits, and other lifestyle factors that may be contributing to hypertension.  Visit your health care provider regularly. Your health care provider can help you create and adjust your plan for managing hypertension. Will I need medicine to control my blood pressure? Your health care provider may prescribe medicine if lifestyle changes are not enough to get your blood pressure under control, and if:  Your systolic blood pressure is 130 or higher.  Your diastolic blood pressure is 80 or higher. Take medicines only as told by your health care provider. Follow the directions carefully. Blood pressure medicines must be taken as  prescribed. The medicine does not work as well when you skip doses. Skipping doses also puts you at risk for problems. Contact a health care provider if:  You think you are having a reaction to medicines you have taken.  You have repeated (recurrent) headaches.  You feel dizzy.  You have swelling in your ankles.  You have trouble with your vision. Get help right away if:  You develop a severe headache or confusion.  You have unusual weakness or numbness, or you feel faint.  You have severe pain in your chest or abdomen.  You vomit repeatedly.  You have trouble breathing. Summary  Hypertension is when the force of blood pumping through your arteries is too strong. If this condition  is not controlled, it may put you at risk for serious complications.  Your personal target blood pressure may vary depending on your medical conditions, your age, and other factors. For most people, a normal blood pressure is less than 120/80.  Hypertension is managed by lifestyle changes, medicines, or both. Lifestyle changes include weight loss, eating a healthy, low-sodium diet, exercising more, and limiting alcohol. This information is not intended to replace advice given to you by your health care provider. Make sure you discuss any questions you have with your health care provider. Document Revised: 08/25/2018 Document Reviewed: 03/31/2016 Elsevier Patient Education  [place], Adult After being diagnosed with an anxiety disorder, you may be relieved to know why you have felt or behaved a certain way. You may also feel overwhelmed about the treatment ahead and what it will mean for your life. With care and support, you can manage this condition and recover from it. How to manage lifestyle changes Managing stress and anxiety  Stress is your body's reaction to life changes and events, both good and bad. Most stress will last just a few hours, but stress can be ongoing  and can lead to more than just stress. Although stress can play a major role in anxiety, it is not the same as anxiety. Stress is usually caused by something external, such as a deadline, test, or competition. Stress normally passes after the triggering event has ended.  Anxiety is caused by something internal, such as imagining a terrible outcome or worrying that something will go wrong that will devastate you. Anxiety often does not go away even after the triggering event is over, and it can become long-term (chronic) worry. It is important to understand the differences between stress and anxiety and to manage your stress effectively so that it does not lead to an anxious response. Talk with your health care provider or a counselor to learn more about reducing anxiety and stress. He or she may suggest tension reduction techniques, such as:  Music therapy. This can include creating or listening to music that you enjoy and that inspires you.  Mindfulness-based meditation. This involves being aware of your normal breaths while not trying to control your breathing. It can be done while sitting or walking.  Centering prayer. This involves focusing on a word, phrase, or sacred image that means something to you and brings you peace.  Deep breathing. To do this, expand your stomach and inhale slowly through your nose. Hold your breath for 3-5 seconds. Then exhale slowly, letting your stomach muscles relax.  Self-talk. This involves identifying thought patterns that lead to anxiety reactions and changing those patterns.  Muscle relaxation. This involves tensing muscles and then relaxing them. Choose a tension reduction technique that suits your lifestyle and personality. These techniques take time and practice. Set aside 5-15 minutes a day to do them. Therapists can offer counseling and training in these techniques. The training to help with anxiety may be covered by some insurance plans. Other things you  can do to manage stress and anxiety include:  Keeping a stress/anxiety diary. This can help you learn what triggers your reaction and then learn ways to manage your response.  Thinking about how you react to certain situations. You may not be able to control everything, but you can control your response.  Making time for activities that help you relax and not feeling guilty about spending your time in this way.  Visual imagery and yoga can  help you stay calm and relax.  Medicines Medicines can help ease symptoms. Medicines for anxiety include:  Anti-anxiety drugs.  Antidepressants. Medicines are often used as a primary treatment for anxiety disorder. Medicines will be prescribed by a health care provider. When used together, medicines, psychotherapy, and tension reduction techniques may be the most effective treatment. Relationships Relationships can play a big part in helping you recover. Try to spend more time connecting with trusted friends and family members. Consider going to couples counseling, taking family education classes, or going to family therapy. Therapy can help you and others better understand your condition. How to recognize changes in your anxiety Everyone responds differently to treatment for anxiety. Recovery from anxiety happens when symptoms decrease and stop interfering with your daily activities at home or work. This may mean that you will start to:  Have better concentration and focus. Worry will interfere less in your daily thinking.  Sleep better.  Be less irritable.  Have more energy.  Have improved memory. It is important to recognize when your condition is getting worse. Contact your health care provider if your symptoms interfere with home or work and you feel like your condition is not improving. Follow these instructions at home: Activity  Exercise. Most adults should do the following: ? Exercise for at least 150 minutes each week. The exercise  should increase your heart rate and make you sweat (moderate-intensity exercise). ? Strengthening exercises at least twice a week.  Get the right amount and quality of sleep. Most adults need 7-9 hours of sleep each night. Lifestyle   Eat a healthy diet that includes plenty of vegetables, fruits, whole grains, low-fat dairy products, and lean protein. Do not eat a lot of foods that are high in solid fats, added sugars, or salt.  Make choices that simplify your life.  Do not use any products that contain nicotine or tobacco, such as cigarettes, e-cigarettes, and chewing tobacco. If you need help quitting, ask your health care provider.  Avoid caffeine, alcohol, and certain over-the-counter cold medicines. These may make you feel worse. Ask your pharmacist which medicines to avoid. General instructions  Take over-the-counter and prescription medicines only as told by your health care provider.  Keep all follow-up visits as told by your health care provider. This is important. Where to find support You can get help and support from these sources:  Self-help groups.  Online and [employer].  A trusted spiritual leader.  Couples counseling.  Family education classes.  Family therapy. Where to find more information You may find that joining a support group helps you deal with your anxiety. The following sources can help you locate counselors or support groups near you:  [place]: www.mentalhealthamerica.net  Anxiety and Depression Association of [place] (ADAA): https://www.clark.net/  National Alliance on Mental Illness (NAMI): www.nami.org Contact a health care provider if you:  Have a hard time staying focused or finishing daily tasks.  Spend many hours a day feeling worried about everyday life.  Become exhausted by worry.  Start to have headaches, feel tense, or have nausea.  Urinate more than normal.  Have diarrhea. Get help right away if you  have:  A racing heart and shortness of breath.  Thoughts of hurting yourself or others. If you ever feel like you may hurt yourself or others, or have thoughts about taking your own life, get help right away. You can go to your nearest emergency department or call:  Your local emergency services (911 in  the [place]  A suicide crisis helpline, such as the [place] at (726)441-5716. This is open 24 hours a day. Summary  Taking steps to learn and use tension reduction techniques can help calm you and help prevent triggering an anxiety reaction.  When used together, medicines, psychotherapy, and tension reduction techniques may be the most effective treatment.  Family, friends, and partners can play a big part in helping you recover from an anxiety disorder. This information is not intended to replace advice given to you by your health care provider. Make sure you discuss any questions you have with your health care provider. Document Revised: 10/03/2018 Document Reviewed: 10/03/2018 Elsevier Patient Education  [place].

## 2019-09-01 NOTE — Assessment & Plan Note (Signed)
Her weight was 160 pounds before she started on Zoloft.  She had loss of her brother, grief and over eating. Her weight has remained stable over the last month.  We talked about possible weight gain with this medication.  She is advised to follow a healthy diet plan, increase exercise, follow-up in 3 months.  If weight gain continues, or if she is unable to lose weight on Zoloft, we will need to change medication.

## 2019-09-03 ENCOUNTER — Telehealth: Payer: Self-pay

## 2019-09-03 NOTE — Telephone Encounter (Signed)
2wk f/u has been scheduled with labs. She will schedule 30mlab capt after appointment. AVS is available in mychart.

## 2019-09-03 NOTE — Telephone Encounter (Signed)
-----   Message from Marval Regal, NP sent at 09/01/2019 11:58 AM EDT -----  1. She will need a lab appt in 2 weeks for Bmet 2. She will need a lab appt in 3 mos for CBC with DIFF in 3 mos 3. Please Confirm that she received the AVS when I closed the telephone note- or do you need to mail it to her?  Thank you!

## 2019-09-05 ENCOUNTER — Telehealth: Payer: Self-pay | Admitting: Nurse Practitioner

## 2019-09-05 DIAGNOSIS — F4321 Adjustment disorder with depressed mood: Secondary | ICD-10-CM

## 2019-09-05 MED ORDER — SERTRALINE HCL 50 MG PO TABS: 100.0000 mg | ORAL_TABLET | Freq: Every day | ORAL | 0 refills | Status: DC

## 2019-09-05 NOTE — Telephone Encounter (Signed)
Pt needs refill on sertraline (ZOLOFT) 50 MG tablet

## 2019-09-13 ENCOUNTER — Other Ambulatory Visit: Payer: Self-pay

## 2019-09-13 ENCOUNTER — Encounter: Payer: Self-pay | Admitting: Nurse Practitioner

## 2019-09-13 ENCOUNTER — Ambulatory Visit: Payer: BC Managed Care – PPO | Admitting: Nurse Practitioner

## 2019-09-13 VITALS — BP 122/80 | HR 86 | Temp 97.1°F | Ht 67.0 in | Wt 190.0 lb

## 2019-09-13 DIAGNOSIS — F419 Anxiety disorder, unspecified: Secondary | ICD-10-CM

## 2019-09-13 DIAGNOSIS — F4321 Adjustment disorder with depressed mood: Secondary | ICD-10-CM

## 2019-09-13 DIAGNOSIS — R52 Pain, unspecified: Secondary | ICD-10-CM

## 2019-09-13 DIAGNOSIS — F329 Major depressive disorder, single episode, unspecified: Secondary | ICD-10-CM

## 2019-09-13 DIAGNOSIS — I10 Essential (primary) hypertension: Secondary | ICD-10-CM

## 2019-09-13 DIAGNOSIS — F32A Depression, unspecified: Secondary | ICD-10-CM

## 2019-09-13 LAB — URINALYSIS, ROUTINE W REFLEX MICROSCOPIC
Bilirubin Urine: NEGATIVE
Hgb urine dipstick: NEGATIVE
Leukocytes,Ua: NEGATIVE
Nitrite: NEGATIVE
RBC / HPF: NONE SEEN (ref 0–?)
Specific Gravity, Urine: 1.02 (ref 1.000–1.030)
Total Protein, Urine: NEGATIVE
Urine Glucose: NEGATIVE
Urobilinogen, UA: 0.2 (ref 0.0–1.0)
pH: 7 (ref 5.0–8.0)

## 2019-09-13 LAB — CBC WITH DIFFERENTIAL/PLATELET
Basophils Absolute: 0.1 10*3/uL (ref 0.0–0.1)
Basophils Relative: 1.1 % (ref 0.0–3.0)
Eosinophils Absolute: 0.5 10*3/uL (ref 0.0–0.7)
Eosinophils Relative: 7.3 % — ABNORMAL HIGH (ref 0.0–5.0)
HCT: 40.1 % (ref 36.0–46.0)
Hemoglobin: 13.3 g/dL (ref 12.0–15.0)
Lymphocytes Relative: 28.7 % (ref 12.0–46.0)
Lymphs Abs: 2 10*3/uL (ref 0.7–4.0)
MCHC: 33.3 g/dL (ref 30.0–36.0)
MCV: 92.9 fl (ref 78.0–100.0)
Monocytes Absolute: 0.7 10*3/uL (ref 0.1–1.0)
Monocytes Relative: 9.8 % (ref 3.0–12.0)
Neutro Abs: 3.7 10*3/uL (ref 1.4–7.7)
Neutrophils Relative %: 53.1 % (ref 43.0–77.0)
Platelets: 282 10*3/uL (ref 150.0–400.0)
RBC: 4.31 Mil/uL (ref 3.87–5.11)
RDW: 12.6 % (ref 11.5–15.5)
WBC: 6.9 10*3/uL (ref 4.0–10.5)

## 2019-09-13 LAB — BASIC METABOLIC PANEL
BUN: 19 mg/dL (ref 6–23)
CO2: 26 mEq/L (ref 19–32)
Calcium: 8.9 mg/dL (ref 8.4–10.5)
Chloride: 104 mEq/L (ref 96–112)
Creatinine, Ser: 0.8 mg/dL (ref 0.40–1.20)
GFR: 77.29 mL/min (ref >60.00)
Glucose, Bld: 93 mg/dL (ref 70–99)
Potassium: 3.7 mEq/L (ref 3.5–5.1)
Sodium: 137 mEq/L (ref 135–145)

## 2019-09-13 LAB — C-REACTIVE PROTEIN: CRP: <1 mg/dL (ref 0.5–20.0)

## 2019-09-13 LAB — MICROALBUMIN / CREATININE URINE RATIO
Creatinine,U: 115.9 mg/dL
Microalb Creat Ratio: 0.6 mg/g (ref 0.0–30.0)
Microalb, Ur: <0.7 mg/dL (ref 0.0–1.9)

## 2019-09-13 LAB — SEDIMENTATION RATE: Sed Rate: 44 mm/hr — ABNORMAL HIGH (ref 0–20)

## 2019-09-13 MED ORDER — SERTRALINE HCL 50 MG PO TABS: 100.0000 mg | ORAL_TABLET | Freq: Every day | ORAL | 0 refills | Status: DC

## 2019-09-13 NOTE — Progress Notes (Signed)
Established Patient Office Visit  Subjective:  Patient ID: Lori Dawson, female    DOB: 05-Mar-1976  Age: 44 y.o. MRN: [id number]  CC:  Chief Complaint  Patient presents with  . Follow-up    and labwork    HPI Lori Dawson presents for a follow- up visit of HTN.  HTN: She is taking HCTZ 12.5 mg daily. She has felt like she had fluid retention in her hands and ankles and does notice minimal  Improvement. Hands do not feel as tight in the morning, but still aches. Sometimes, she can't wear her wedding band. Her BP at home readings have been : 145/90, 136/88, 142/83,135/81, 138/ 89, 128/83, 130/90.  BP Readings from Last 3 Encounters:  09/13/19 122/80  08/30/19 (!) 137/96  08/09/19 120/80   Generalized body aches:  She has chronic aches x years  in multiple joints: bilateral knees, back,  and her body in general always  feels achey. The hands are the most noticeable and other sites have a vague feeling of discomfort.  No joint swelling, erythema, rashes, and it does not seem to be worse in the morning and relieved as the day goes on.  She wonders if she could have rheumatoid arthritis or fibromyalgia.  She can recall no tick bite exposure.  Anxiety/grief: Joellen Jersey reports she has been feeling much better on much better on Zoloft 100 mg daily.  She no longer has feelings of anxiety, and her grief has improved.  She has no mental health concerns today.  She would like to remain on Zoloft.  Obesity: BMI 29.76.  Her weight is stable.  She is working on [employer] and exercise.  Wt Readings from Last 3 Encounters:  09/13/19 190 lb (86.2 kg)  08/30/19 191 lb (86.6 kg)  08/09/19 191 lb 3.2 oz (86.7 kg)   Vaccines: Moderna Covid vaccine 08/02/2019 and 08/31/2019  Past Medical History:  Diagnosis Date  . Abnormal mammogram 11/10/2016  . Allergy    seasonal  . Atypical chest pain 09/22/2017  . 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 Environmental and seasonal allergies 09/01/2019  .  Herniated lumbar intervertebral disc   . HTN (hypertension) 08/09/2019  . Low back pain 08/09/2019  . Mass of lower inner quadrant of left breast 11/17/2016  . Migraine headache   . Overweight (BMI 25.0-29.9) 08/09/2019  . Palpitations 09/22/2017    Past Surgical History:  Procedure Laterality Date  . AUGMENTATION MAMMAPLASTY  2004   in [place], right breast was larger  . BREAST BIOPSY Left 11/18/2016   core bx Byrnett, neg  . TUBAL LIGATION  2006    Family History  Problem Relation Age of Onset  . Cervical cancer Maternal Aunt   . Diabetes Paternal Grandmother   . Lung cancer Paternal Grandmother   . Lung cancer Paternal Grandfather   . Thyroid disease Mother   . Hypertension Father   . Hyperlipidemia Father   . Heart attack Brother 70  . Coronary artery disease Brother   . Coronary artery disease Maternal Grandfather   . Breast cancer Neg Hx     Social History   Socioeconomic History  . Marital status: Married    Spouse name: Not on file  . Number of children: Not on file  . Years of education: Not on file  . Highest education level: Not on file  Occupational History  . Not on file  Tobacco Use  . Smoking status: Never Smoker  . Smokeless tobacco: Never  Used  Substance and Sexual Activity  . Alcohol use: Yes    Alcohol/week: 3.0 standard drinks    Types: 3 Glasses of wine per week  . Drug use: Never  . Sexual activity: Yes    Birth control/protection: Surgical, Implant    Comment: Tubal ligation   Other Topics Concern  . Not on file  Social History Narrative    Married 22 years. 2 girls- college and DTR in grad school. 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   Social Determinants of Health   Financial Resource Strain:   . Difficulty of Paying Living Expenses:   Food Insecurity:   . Worried About Charity fundraiser in the Last Year:   . Arboriculturist in the Last Year:   Transportation Needs:   . Film/video editor (Medical):   Marland Kitchen Lack of  Transportation (Non-Medical):   Physical Activity:   . Days of Exercise per Week:   . Minutes of Exercise per Session:   Stress:   . Feeling of Stress :   Social Connections:   . Frequency of Communication with Friends and Family:   . Frequency of Social Gatherings with Friends and Family:   . Attends Religious Services:   . Active Member of Clubs or Organizations:   . Attends Archivist Meetings:   Marland Kitchen Marital Status:   Intimate Partner Violence:   . Fear of Current or Ex-Partner:   . Emotionally Abused:   Marland Kitchen Physically Abused:   . Sexually Abused:     Outpatient Medications Prior to Visit  Medication Sig Dispense Refill  . cetirizine (ZYRTEC) 10 MG tablet Take 10 mg by mouth daily.    . hydrochlorothiazide (MICROZIDE) 12.5 MG capsule Take 1 capsule (12.5 mg total) by mouth daily. 90 capsule 0  . levonorgestrel-ethinyl estradiol (LARISSIA) 0.1-20 MG-MCG tablet TAKE 1 TABLET BY MOUTH EVERY DAY CONTINUOUS DOSING 84 tablet 3  . montelukast (SINGULAIR) 10 MG tablet     . Olopatadine HCl 0.2 % SOLN     . ondansetron (ZOFRAN ODT) 4 MG disintegrating tablet Take 1 tablet (4 mg total) by mouth every 6 (six) hours as needed for nausea. 30 tablet 0  . sertraline (ZOLOFT) 50 MG tablet Take 2 tablets (100 mg total) by mouth daily. 30 tablet 0   No facility-administered medications prior to visit.    Allergies  Allergen Reactions  . Levofloxacin Nausea And Vomiting    Other reaction(s): Dizziness     Review of Systems  Constitutional: Negative.  Negative for chills and fever.  HENT: Negative for congestion and sore throat.   Respiratory: Negative for cough and shortness of breath.   Cardiovascular: Negative for chest pain.  Gastrointestinal: Negative for abdominal pain.  Endocrine: Negative for cold intolerance and heat intolerance.  Genitourinary: Negative for difficulty urinating and dysuria.  Musculoskeletal: Positive for arthralgias.  Neurological: Negative.     Psychiatric/Behavioral:       Doing well on Zoloft 100 mg- no SI.      Objective:    Physical Exam  Constitutional: She is oriented to person, place, and time. She appears well-developed and well-nourished.  HENT:  Head: Normocephalic and atraumatic.  Cardiovascular: Normal rate and regular rhythm.  Pulmonary/Chest: Effort normal and breath sounds normal.  Abdominal: Soft. There is no abdominal tenderness.  Musculoskeletal:        General: No edema.  Neurological: She is alert and oriented to person, place, and time.  Psychiatric:  She has a normal mood and affect. Her behavior is normal. Judgment and thought content normal.  Vitals reviewed.   BP 122/80 (BP Location: Right Arm, Patient Position: Sitting, Cuff Size: Small)   Pulse 86   Temp (!) 97.1 F (36.2 C) (Skin)   Ht 5' 7"  (1.702 m)   Wt 190 lb (86.2 kg)   SpO2 98%   BMI 29.76 kg/m  Wt Readings from Last 3 Encounters:  09/13/19 190 lb (86.2 kg)  08/30/19 191 lb (86.6 kg)  08/09/19 191 lb 3.2 oz (86.7 kg)    There are no preventive care reminders to display for this patient.  There are no preventive care reminders to display for this patient.  Lab Results  Component Value Date   TSH 0.84 08/09/2019   Lab Results  Component Value Date   WBC 6.9 09/13/2019   HGB 13.3 09/13/2019   HCT 40.1 09/13/2019   MCV 92.9 09/13/2019   PLT 282.0 09/13/2019   Lab Results  Component Value Date   NA 137 09/13/2019   K 3.7 09/13/2019   CO2 26 09/13/2019   GLUCOSE 93 09/13/2019   BUN 19 09/13/2019   CREATININE 0.80 09/13/2019   BILITOT 0.5 08/09/2019   ALKPHOS 62 08/09/2019   AST 22 08/09/2019   ALT 19 08/09/2019   PROT 7.5 08/09/2019   ALBUMIN 4.1 08/09/2019   CALCIUM 8.9 09/13/2019   GFR 77.29 09/13/2019   Lab Results  Component Value Date   CHOL 171 08/09/2019   Lab Results  Component Value Date   HDL 42.50 08/09/2019   Lab Results  Component Value Date   LDLCALC 105 (H) 08/09/2019   Lab Results   Component Value Date   TRIG 116.0 08/09/2019   Lab Results  Component Value Date   CHOLHDL 4 08/09/2019   Lab Results  Component Value Date   HGBA1C 5.2 08/09/2019      Assessment & Plan:   Problem List Items Addressed This Visit      Cardiovascular and Mediastinum   HTN (hypertension) - Primary    BP Readings from Last 3 Encounters:  09/13/19 122/80  08/30/19 (!) 137/96  08/09/19 120/80   Advised to follow DASH diet. BP elevated at home and she confirms cuff accuracy as her husband's BP is always the same. She is advised to take her BP at rest, after sitting down, feet uncrossed for 5 min and not after eating or after stressful events, arm at heart level and supported. Send in report over the next 2 weeks.        Relevant Orders   Basic Metabolic Panel (BMET) (Completed)   Urinalysis, Routine w reflex microscopic (Completed)   Urine Microalbumin w/creat. ratio (Completed)     Other   Anxiety and depression    Doing well on Zoloft 100 mg daily. Grief reaction- improved.No depression or anxiety. BMI 29.76.       Relevant Medications   sertraline (ZOLOFT) 50 MG tablet   Generalized body aches    She has chronic aches x years  in multiple joints: bilateral knees, back,  and her body in general always  feels achey. The hands are the most noticeable and other sites have a vague feeling of discomfort.  No joint swelling, erythema, rashes, and it does not seem to be worse in the morning and relieved as the day goes on.  She wonders if she could have rheumatoid arthritis or fibromyalgia.  She can recall no tick bite exposure.  Plan: Check ESR, CRP, ANA and consider Rheum referral.       Relevant Orders   Sedimentation rate (Completed)   Antinuclear Antib (ANA) (Completed)   C-reactive protein (Completed)   CBC with Differential/Platelet (Completed)    Other Visit Diagnoses    Grief reaction       Rx RF zoloft. F/u prn   Relevant Medications   sertraline (ZOLOFT) 50 MG  tablet      Meds ordered this encounter  Medications  . sertraline (ZOLOFT) 50 MG tablet    Sig: Take 2 tablets (100 mg total) by mouth daily.    Dispense:  180 tablet    Refill:  0    Order Specific Question:   Supervising Provider    Answer:   Einar Pheasant [[id number]]    Follow-up: Return in about 3 months (around 12/13/2019).   This visit occurred during the SARS-CoV-2 public health emergency.  Safety protocols were in place, including screening questions prior to the visit, additional usage of staff PPE, and extensive cleaning of exam room while observing appropriate contact time as indicated for disinfecting solutions.    Denice Paradise, NP

## 2019-09-13 NOTE — Patient Instructions (Addendum)
Please go to the lab after this visit.   I will MY chart your results.   I refilled the Zoloft.   Follow-up visit in 3 mos.   Healthy/low salt diet.  DASH Eating Plan DASH stands for "Dietary Approaches to Stop Hypertension." The DASH eating plan is a healthy eating plan that has been shown to reduce high blood pressure (hypertension). It may also reduce your risk for type 2 diabetes, heart disease, and stroke. The DASH eating plan may also help with weight loss. What are tips for following this plan?  General guidelines  Avoid eating more than 2,300 mg (milligrams) of salt (sodium) a day. If you have hypertension, you may need to reduce your sodium intake to 1,500 mg a day.  Limit alcohol intake to no more than 1 drink a day for nonpregnant women and 2 drinks a day for men. One drink equals 12 oz of beer, 5 oz of wine, or 1 oz of hard liquor.  Work with your health care provider to maintain a healthy body weight or to lose weight. Ask what an ideal weight is for you.  Get at least 30 minutes of exercise that causes your heart to beat faster (aerobic exercise) most days of the week. Activities may include walking, swimming, or biking.  Work with your health care provider or diet and nutrition specialist (dietitian) to adjust your eating plan to your individual calorie needs. Reading food labels   Check food labels for the amount of sodium per serving. Choose foods with less than 5 percent of the Daily Value of sodium. Generally, foods with less than 300 mg of sodium per serving fit into this eating plan.  To find whole grains, look for the word "whole" as the first word in the ingredient list. Shopping  Buy products labeled as "low-sodium" or "no salt added."  Buy fresh foods. Avoid canned foods and premade or frozen meals. Cooking  Avoid adding salt when cooking. Use salt-free seasonings or herbs instead of table salt or sea salt. Check with your health care provider or  pharmacist before using salt substitutes.  Do not fry foods. Cook foods using healthy methods such as baking, boiling, grilling, and broiling instead.  Cook with heart-healthy oils, such as olive, canola, soybean, or sunflower oil. Meal planning  Eat a balanced diet that includes: ? 5 or more servings of fruits and vegetables each day. At each meal, try to fill half of your plate with fruits and vegetables. ? Up to 6-8 servings of whole grains each day. ? Less than 6 oz of lean meat, poultry, or fish each day. A 3-oz serving of meat is about the same size as a deck of cards. One egg equals 1 oz. ? 2 servings of low-fat dairy each day. ? A serving of nuts, seeds, or beans 5 times each week. ? Heart-healthy fats. Healthy fats called Omega-3 fatty acids are found in foods such as flaxseeds and coldwater fish, like sardines, salmon, and mackerel.  Limit how much you eat of the following: ? Canned or prepackaged foods. ? Food that is high in trans fat, such as fried foods. ? Food that is high in saturated fat, such as fatty meat. ? Sweets, desserts, sugary drinks, and other foods with added sugar. ? Full-fat dairy products.  Do not salt foods before eating.  Try to eat at least 2 vegetarian meals each week.  Eat more home-cooked food and less restaurant, buffet, and fast food.  When eating  at a restaurant, ask that your food be prepared with less salt or no salt, if possible. What foods are recommended? The items listed may not be a complete list. Talk with your dietitian about what dietary choices are best for you. Grains Whole-grain or whole-wheat bread. Whole-grain or whole-wheat pasta. Brown rice. [place]. Bulgur. Whole-grain and low-sodium cereals. Pita bread. Low-fat, low-sodium crackers. Whole-wheat flour tortillas. Vegetables Fresh or frozen vegetables (raw, steamed, roasted, or grilled). Low-sodium or reduced-sodium tomato and vegetable juice. Low-sodium or  reduced-sodium tomato sauce and tomato paste. Low-sodium or reduced-sodium canned vegetables. Fruits All fresh, dried, or frozen fruit. Canned fruit in natural juice (without added sugar). Meat and other protein foods Skinless chicken or [place]. Ground chicken or [place]. Pork with fat trimmed off. Fish and seafood. Egg whites. Dried beans, peas, or lentils. Unsalted nuts, nut butters, and seeds. Unsalted canned beans. Lean cuts of beef with fat trimmed off. Low-sodium, lean deli meat. Dairy Low-fat (1%) or fat-free (skim) milk. Fat-free, low-fat, or reduced-fat cheeses. Nonfat, low-sodium ricotta or cottage cheese. Low-fat or nonfat yogurt. Low-fat, low-sodium cheese. Fats and oils Soft margarine without trans fats. Vegetable oil. Low-fat, reduced-fat, or light mayonnaise and salad dressings (reduced-sodium). Canola, safflower, olive, soybean, and sunflower oils. Avocado. Seasoning and other foods Herbs. Spices. Seasoning mixes without salt. Unsalted popcorn and pretzels. Fat-free sweets. What foods are not recommended? The items listed may not be a complete list. Talk with your dietitian about what dietary choices are best for you. Grains Baked goods made with fat, such as croissants, muffins, or some breads. Dry pasta or rice meal packs. Vegetables Creamed or fried vegetables. Vegetables in a cheese sauce. Regular canned vegetables (not low-sodium or reduced-sodium). Regular canned tomato sauce and paste (not low-sodium or reduced-sodium). Regular tomato and vegetable juice (not low-sodium or reduced-sodium). Angie Fava. Olives. Fruits Canned fruit in a light or heavy syrup. Fried fruit. Fruit in cream or butter sauce. Meat and other protein foods Fatty cuts of meat. Ribs. Fried meat. Berniece Salines. Sausage. Bologna and other processed lunch meats. Salami. Fatback. Hotdogs. Bratwurst. Salted nuts and seeds. Canned beans with added salt. Canned or smoked fish. Whole eggs or egg yolks. Chicken or [place]  with skin. Dairy Whole or 2% milk, cream, and half-and-half. Whole or full-fat cream cheese. Whole-fat or sweetened yogurt. Full-fat cheese. Nondairy creamers. Whipped toppings. Processed cheese and cheese spreads. Fats and oils Butter. Stick margarine. Lard. Shortening. Ghee. Bacon fat. Tropical oils, such as coconut, palm kernel, or palm oil. Seasoning and other foods Salted popcorn and pretzels. Onion salt, garlic salt, seasoned salt, table salt, and sea salt. Worcestershire sauce. Tartar sauce. Barbecue sauce. Teriyaki sauce. Soy sauce, including reduced-sodium. Steak sauce. Canned and packaged gravies. Fish sauce. Oyster sauce. Cocktail sauce. Horseradish that you find on the shelf. Ketchup. Mustard. Meat flavorings and tenderizers. Bouillon cubes. Hot sauce and Tabasco sauce. Premade or packaged marinades. Premade or packaged taco seasonings. Relishes. Regular salad dressings. Where to find more information:  National Heart, Lung, and [place]: https://wilson-eaton.com/  American Heart Association: www.heart.org Summary  The DASH eating plan is a healthy eating plan that has been shown to reduce high blood pressure (hypertension). It may also reduce your risk for type 2 diabetes, heart disease, and stroke.  With the DASH eating plan, you should limit salt (sodium) intake to 2,300 mg a day. If you have hypertension, you may need to reduce your sodium intake to 1,500 mg a day.  When on the DASH eating plan, aim to  eat more fresh fruits and vegetables, whole grains, lean proteins, low-fat dairy, and heart-healthy fats.  Work with your health care provider or diet and nutrition specialist (dietitian) to adjust your eating plan to your individual calorie needs. This information is not intended to replace advice given to you by your health care provider. Make sure you discuss any questions you have with your health care provider. Document Revised: 04/15/2017 Document Reviewed:  04/26/2016 Elsevier Patient Education  [place] Risks of Being Overweight Maintaining a healthy body weight is an important part of your overall health. Your healthy body weight depends on your age, gender, and height. Being overweight puts you at risk for many health problems, including:  Heart disease.  Diabetes.  Problems sleeping.  Joint problems. You can make changes to your diet and lifestyle to prevent these risks. Consider working with a health care provider or a dietitian to make these changes. What nutrition changes can be made?   Eat only as much as your body needs. In most cases, this is about 2,000 calories a day, but the amount varies depending on your height, gender, and activity level. Ask your health care provider how many calories you should have each day. Eating more than your body needs on a regular basis can cause you to become overweight or obese.  Eat slowly, and stop eating when you feel full.  Choose healthy foods, including: ? Fruits and vegetables. ? Lean meats. ? Low-fat dairy products. ? High-fiber foods, such as whole grains and beans. ? Healthy snacks like vegetable sticks, a piece of fruit, or a small amount of yogurt or cheese.  Avoid foods and drinks that are high in sugar, salt (sodium), saturated fat, or trans fat. This includes: ? Many desserts such as candy, cookies, and ice cream. ? Soda. ? Fried foods. ? Processed meats such as hot dogs or lunch meats. ? Prepackaged snack foods. What lifestyle changes can be made?   Exercise for at least 150 minutes a week to prevent weight gain, or as often as recommended by your health care provider. Do moderate-intensity exercise, such as brisk walking. ? Spread it out by exercising for 30 minutes 5 days a week, or in short 10-minute bursts several times a day.  Find other ways to stay active and burn calories, such as yard work or a hobby that involves physical  activity.  Get at least 8 hours of sleep each night. When you are well-rested, you are more likely to be active and make healthy choices during the day. To sleep better: ? Try to go to bed and wake up at about the same time every day. ? Keep your bedroom dark, quiet, and cool. ? Make sure that your bed is comfortable. ? Avoid stimulating activities, such as watching television or exercising, for at least one hour before bedtime. Why are these changes important? Eating healthy and being active helps you lose weight and prevent health problems caused by being overweight. Making these changes can also help you manage stress, feel better mentally, and connect with friends and family. What can happen if changes are not made? Being overweight can affect you for your entire life. You may develop joint or bone problems that make it painful or difficult for you to play sports or do activities you enjoy. Being overweight puts stress on your heart and lungs and can lead to medical problems like diabetes, heart disease, and sleeping problems. Where to find support You can  get support for preventing health risks of being overweight from:  Your health care provider or a dietitian. They can provide guidance about healthy eating and healthy lifestyle choices.  Weight loss support groups, online or in-person. Where to find more information  MyPlate: FormerBoss.no ? This an online tool that provides personalized recommendations about foods to eat each day.  The Centers for Disease Control and Prevention: http://sharp-hammond.biz/ ? This resource gives tips for managing weight and having an active lifestyle. Summary  To prevent unhealthy weight gain, it is important to maintain a healthy diet high in vegetables and whole grains, exercise regularly, and get at least 8 hours of sleep each night.  Making these changes helps prevent many long-term (chronic) health conditions that can shorten your life,  such as diabetes, heart disease, and stroke. This information is not intended to replace advice given to you by your health care provider. Make sure you discuss any questions you have with your health care provider. Document Revised: 01/24/2019 Document Reviewed: 03/30/2017 Elsevier Patient Education  [place].

## 2019-09-14 LAB — ANA: Anti Nuclear Antibody (ANA): NEGATIVE

## 2019-09-15 ENCOUNTER — Telehealth: Payer: Self-pay | Admitting: Nurse Practitioner

## 2019-09-15 ENCOUNTER — Encounter: Payer: Self-pay | Admitting: Nurse Practitioner

## 2019-09-15 DIAGNOSIS — R52 Pain, unspecified: Secondary | ICD-10-CM | POA: Insufficient documentation

## 2019-09-15 NOTE — Assessment & Plan Note (Signed)
She has chronic aches x years  in multiple joints: bilateral knees, back,  and her body in general always  feels achey. The hands are the most noticeable and other sites have a vague feeling of discomfort.  No joint swelling, erythema, rashes, and it does not seem to be worse in the morning and relieved as the day goes on.  She wonders if she could have rheumatoid arthritis or fibromyalgia.  She can recall no tick bite exposure. Plan: Check ESR, CRP, ANA and consider Rheum referral.

## 2019-09-15 NOTE — Assessment & Plan Note (Addendum)
Doing well on Zoloft 100 mg daily. Grief reaction- improved.No depression or anxiety. BMI 29.76.

## 2019-09-15 NOTE — Telephone Encounter (Signed)
I sent My chart message and await reply.   You labs show elevated ESR- non-specific inflammatory marker. Would you  agree to RHEUMATOLOGY consult since you have so much joint pain?   The slight elevation in EOS is elevated and is likely related to allergies.

## 2019-09-15 NOTE — Assessment & Plan Note (Addendum)
BP Readings from Last 3 Encounters:  09/13/19 122/80  08/30/19 (!) 137/96  08/09/19 120/80   Advised to follow DASH diet. BP elevated at home and she confirms cuff accuracy as her husband's BP is always the same. She is advised to take her BP at rest, after sitting down, feet uncrossed for 5 min and not after eating or after stressful events, arm at heart level and supported. Send in report over the next 2 weeks.

## 2019-09-17 ENCOUNTER — Telehealth: Payer: Self-pay | Admitting: Nurse Practitioner

## 2019-09-17 DIAGNOSIS — R700 Elevated erythrocyte sedimentation rate: Secondary | ICD-10-CM

## 2019-09-17 DIAGNOSIS — R52 Pain, unspecified: Secondary | ICD-10-CM

## 2019-09-19 NOTE — Telephone Encounter (Signed)
I placed the referral in to Rheumatology.

## 2019-09-20 ENCOUNTER — Encounter: Payer: Self-pay | Admitting: Obstetrics and Gynecology

## 2019-09-20 ENCOUNTER — Other Ambulatory Visit: Payer: Self-pay

## 2019-09-20 ENCOUNTER — Ambulatory Visit (INDEPENDENT_AMBULATORY_CARE_PROVIDER_SITE_OTHER): Payer: BC Managed Care – PPO | Admitting: Obstetrics and Gynecology

## 2019-09-20 VITALS — BP 110/80 | Ht 67.0 in | Wt 189.0 lb

## 2019-09-20 DIAGNOSIS — Z30011 Encounter for initial prescription of contraceptive pills: Secondary | ICD-10-CM | POA: Diagnosis not present

## 2019-09-20 DIAGNOSIS — N920 Excessive and frequent menstruation with regular cycle: Secondary | ICD-10-CM

## 2019-09-20 LAB — POCT URINE PREGNANCY: Preg Test, Ur: NEGATIVE

## 2019-09-20 MED ORDER — SLYND 4 MG PO TABS: 1.0000 | ORAL_TABLET | Freq: Every day | ORAL | 0 refills | Status: DC

## 2019-09-20 NOTE — Patient Instructions (Signed)
I value your feedback and entrusting [place] with your care. If you get a [facility] patient survey, I would appreciate you taking the time to let us know about your experience today. Thank you!  As of April 26, 2019, your lab results will be released to your MyChart immediately, before I even have a chance to see them. Please give me time to review them and contact you if there are any abnormalities. Thank you for your patience.

## 2019-09-20 NOTE — Progress Notes (Signed)
Marval Regal, NP   Chief Complaint  Patient presents with  . Menstrual Problem    pt is getting her cycles every 5-6 weeks, has been bleeding heavy since Sat, bright red which is unusual for her, heavy flow has not slowed down    HPI:      [name] is a 44 y.o. G2P0 whose LMP was Patient's last menstrual period was 08/17/2019 (exact date)., presents today for menorrhagia with this cycle. Pt does cont dosing of OCPs and can only go Q5 wks till bleeding starts. Menses usually 7-10 days, 1-2 mod flow days. No BTB, mild dysmen. Menses started after 3 wks this cycle and has been heavy for pt (changing tampons Q2 hrs with clots) for 5 days. Blood is red and pt is concerned about change. No dysmen/dyspareunia. Had neg GYN u/s 2019 and normal thyroid labs 3/21.  She is sex active, no new partners, no dyspareunia/bleeding.  Pt also with new dx of HTN since 6/20 annual. Controlled with HCTZ. Needs to change BC due to HTN.  Past Medical History:  Diagnosis Date  . Abnormal mammogram 11/10/2016  . Allergy    seasonal  . Atypical chest pain 09/22/2017  . 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 Environmental and seasonal allergies 09/01/2019  . Herniated lumbar intervertebral disc   . HTN (hypertension) 08/09/2019  . Low back pain 08/09/2019  . Mass of lower inner quadrant of left breast 11/17/2016  . Migraine headache   . Overweight (BMI 25.0-29.9) 08/09/2019  . Palpitations 09/22/2017    Past Surgical History:  Procedure Laterality Date  . AUGMENTATION MAMMAPLASTY  2004   in [place], right breast was larger  . BREAST BIOPSY Left 11/18/2016   core bx Byrnett, neg  . TUBAL LIGATION  2006    Family History  Problem Relation Age of Onset  . Cervical cancer Maternal Aunt        30s/40s  . Diabetes Paternal Grandmother   . Lung cancer Paternal Grandmother   . Lung cancer Paternal Grandfather   . Thyroid disease Mother   . Hypertension Father   . Hyperlipidemia Father   .  Heart attack Brother 51  . Coronary artery disease Brother   . Coronary artery disease Maternal Grandfather   . Breast cancer Neg Hx     Social History   Socioeconomic History  . Marital status: Married    Spouse name: Not on file  . Number of children: Not on file  . Years of education: Not on file  . Highest education level: Not on file  Occupational History  . Not on file  Tobacco Use  . Smoking status: Never Smoker  . Smokeless tobacco: Never Used  Substance and Sexual Activity  . Alcohol use: Yes    Alcohol/week: 3.0 standard drinks    Types: 3 Glasses of wine per week  . Drug use: Never  . Sexual activity: Yes    Birth control/protection: Surgical, Pill    Comment: Tubal ligation   Other Topics Concern  . Not on file  Social History Narrative    Married 22 years. 2 girls- college and DTR in grad school. 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   Social Determinants of Health   Financial Resource Strain:   . Difficulty of Paying Living Expenses:   Food Insecurity:   . Worried About Charity fundraiser in the Last Year:   . [employer]  in the Last Year:   Transportation Needs:   . Film/video editor (Medical):   Marland Kitchen Lack of Transportation (Non-Medical):   Physical Activity:   . Days of Exercise per Week:   . Minutes of Exercise per Session:   Stress:   . Feeling of Stress :   Social Connections:   . Frequency of Communication with Friends and Family:   . Frequency of Social Gatherings with Friends and Family:   . Attends Religious Services:   . Active Member of Clubs or Organizations:   . Attends Archivist Meetings:   Marland Kitchen Marital Status:   Intimate Partner Violence:   . Fear of Current or Ex-Partner:   . Emotionally Abused:   Marland Kitchen Physically Abused:   . Sexually Abused:     Outpatient Medications Prior to Visit  Medication Sig Dispense Refill  . cetirizine (ZYRTEC) 10 MG tablet Take 10 mg by mouth daily.    . hydrochlorothiazide  (MICROZIDE) 12.5 MG capsule Take 1 capsule (12.5 mg total) by mouth daily. 90 capsule 0  . levonorgestrel-ethinyl estradiol (LARISSIA) 0.1-20 MG-MCG tablet TAKE 1 TABLET BY MOUTH EVERY DAY CONTINUOUS DOSING 84 tablet 3  . montelukast (SINGULAIR) 10 MG tablet     . Olopatadine HCl 0.2 % SOLN     . ondansetron (ZOFRAN ODT) 4 MG disintegrating tablet Take 1 tablet (4 mg total) by mouth every 6 (six) hours as needed for nausea. 30 tablet 0  . sertraline (ZOLOFT) 50 MG tablet Take 2 tablets (100 mg total) by mouth daily. 180 tablet 0   No facility-administered medications prior to visit.      ROS:  Review of Systems  Constitutional: Negative for fever.  Gastrointestinal: Negative for blood in stool, constipation, diarrhea, nausea and vomiting.  Genitourinary: Positive for menstrual problem. Negative for dyspareunia, dysuria, flank pain, frequency, hematuria, urgency, vaginal bleeding, vaginal discharge and vaginal pain.  Musculoskeletal: Negative for back pain.  Skin: Negative for rash.   BREAST: No symptoms   OBJECTIVE:   Vitals:  BP 110/80   Ht 5' 7"  (1.702 m)   Wt 189 lb (85.7 kg)   LMP 08/17/2019 (Exact Date)   BMI 29.60 kg/m   Physical Exam Vitals reviewed.  Constitutional:      Appearance: She is well-developed.  Pulmonary:     Effort: Pulmonary effort is normal.  Genitourinary:    General: Normal vulva.     Pubic Area: No rash.      Labia:        Right: No rash, tenderness or lesion.        Left: No rash, tenderness or lesion.      Vagina: Bleeding present. No vaginal discharge, erythema or tenderness.     Cervix: Normal.     Uterus: Normal. Not enlarged and not tender.      Adnexa: Right adnexa normal and left adnexa normal.       Right: No mass or tenderness.         Left: No mass or tenderness.    Musculoskeletal:        General: Normal range of motion.     Cervical back: Normal range of motion.  Skin:    General: Skin is warm and dry.  Neurological:      General: No focal deficit present.     Mental Status: She is alert and oriented to person, place, and time.  Psychiatric:        Mood and Affect: Mood normal.  Behavior: Behavior normal.        Thought Content: Thought content normal.        Judgment: Judgment normal.    Results for orders placed or performed in visit on 09/20/19 (from the past 24 hour(s))  POCT urine pregnancy     Status: Normal   Collection Time: 09/20/19  4:52 PM  Result Value Ref Range   Preg Test, Ur Negative Negative    Assessment/Plan: Menorrhagia with regular cycle - Plan: Drospirenone (SLYND) 4 MG TABS; Neg exam, neg UPT, Reassurance. Discussed prog only options due to HTN. Pt considering IUD, ablation, but wants to start POPs for now. Start slynd today to stop bleeding, 1 sample given. F/u with bleeding. If still persists in 1-2 wks, will check GYN u/s. If resolves, pt may want to cont POPs. F/u prn.   Encounter for initial prescription of contraceptive pills - Plan: Drospirenone (SLYND) 4 MG TABS    Meds ordered this encounter  Medications  . Drospirenone (SLYND) 4 MG TABS    Sig: Take 1 tablet by mouth daily. 1 SAMPLE GIVEN TO PT    Dispense:  28 tablet    Refill:  0    Order Specific Question:   Supervising Provider    Answer:   Gae Dry [[id number]]      Return if symptoms worsen or fail to improve.  Caliber Landess B. Pamela Maddy, PA-C 09/20/2019 4:54 PM

## 2019-10-04 ENCOUNTER — Telehealth: Payer: Self-pay | Admitting: Nurse Practitioner

## 2019-10-04 NOTE — Telephone Encounter (Signed)
Rejection Reason - Patient did not respond - Have not heard back from this patient as of 10/04/19." [facility] said about 1 hour ago  "Called patient to schedule. She said that she wanted to contact Denice Paradise to discuss who she should see. She will call back to schedule, 08/27/19 at 12:03 PM." [facility] said about 1 month ago

## 2019-10-08 NOTE — Telephone Encounter (Signed)
She needs to see RHEUM- either Dr. Jefm Bryant or Dr. Barb Merino would she like a different referral?   Patient is not responding to calls form RHEUM at [facility].

## 2019-10-08 NOTE — Telephone Encounter (Signed)
Spoke with patient and she does have an appt already scheduled with Rheum 01/09/20 at 9:45am

## 2019-10-09 ENCOUNTER — Other Ambulatory Visit: Payer: Self-pay | Admitting: Obstetrics and Gynecology

## 2019-10-09 DIAGNOSIS — N920 Excessive and frequent menstruation with regular cycle: Secondary | ICD-10-CM

## 2019-10-09 DIAGNOSIS — Z30011 Encounter for initial prescription of contraceptive pills: Secondary | ICD-10-CM

## 2019-10-09 MED ORDER — SLYND 4 MG PO TABS: 1.0000 | ORAL_TABLET | Freq: Every day | ORAL | 0 refills | Status: DC

## 2019-10-09 NOTE — Telephone Encounter (Signed)
Cont.Marland Kitchen

## 2019-10-09 NOTE — Telephone Encounter (Signed)
Advise

## 2019-10-16 DIAGNOSIS — Z1211 Encounter for screening for malignant neoplasm of colon: Secondary | ICD-10-CM

## 2019-10-16 HISTORY — DX: Encounter for screening for malignant neoplasm of colon: Z12.11

## 2019-11-05 NOTE — Progress Notes (Signed)
Chief Complaint  Patient presents with  . Gynecologic Exam     HPI:      [name] is a 44 y.o. G2P0 who LMP was No LMP recorded (lmp unknown). (Menstrual status: Oral contraceptives)., presents today for her annual examination. Her menses are absent with slynd, changed from OCPs 5/21 due to new HTN dx. Being treated for menometrorrhagia. Had neg DUB eval 2019 with labs and u/s. Vasomotor sx from last yr improved.   Sex activity: single partner, contraception - tubal ligation.  Last Pap: 08/15/17  Results were: no abnormalities /neg HPV DNA  Hx of STDs: none  Last mammogram: 12/25/18  Results were: normal; repeat in 12 months.  There is no FH of breast cancer. There is no FH of ovarian cancer. The patient does do self-breast exams.  Tobacco use: The patient denies current or previous tobacco use. Alcohol use: social drinker No drug use Exercise: moderately active  She does get adequate calcium and Vitamin D in her diet.  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 Doing well with it. May want to come off soon.    Past Medical History:  Diagnosis Date  . Abnormal mammogram 11/10/2016  . Allergy    seasonal  . Atypical chest pain 09/22/2017  . 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 Environmental and seasonal allergies 09/01/2019  . Herniated lumbar intervertebral disc   . HTN (hypertension) 08/09/2019  . Low back pain 08/09/2019  . Mass of lower inner quadrant of left breast 11/17/2016  . Migraine headache   . Overweight (BMI 25.0-29.9) 08/09/2019  . Palpitations 09/22/2017    Past Surgical History:  Procedure Laterality Date  . AUGMENTATION MAMMAPLASTY  2004   in [place], right breast was larger  . BREAST BIOPSY Left 11/18/2016   core bx Byrnett, neg  . TUBAL LIGATION  2006    Family History  Problem Relation Age of Onset  . Cervical cancer Maternal Aunt        30s/40s  . Diabetes Paternal Grandmother    . Lung cancer Paternal Grandmother   . Lung cancer Paternal Grandfather   . Thyroid disease Mother   . Hypertension Father   . Hyperlipidemia Father   . Heart attack Brother 74  . Coronary artery disease Brother   . Coronary artery disease Maternal Grandfather   . Breast cancer Neg Hx     Social History   Socioeconomic History  . Marital status: Married    Spouse name: Not on file  . Number of children: Not on file  . Years of education: Not on file  . Highest education level: Not on file  Occupational History  . Not on file  Tobacco Use  . Smoking status: Never Smoker  . Smokeless tobacco: Never Used  Vaping Use  . Vaping Use: Never used  Substance and Sexual Activity  . Alcohol use: Yes    Alcohol/week: 3.0 standard drinks    Types: 3 Glasses of wine per week  . Drug use: Never  . Sexual activity: Yes    Birth control/protection: Surgical, Pill    Comment: Tubal ligation   Other Topics Concern  . Not on file  Social History Narrative    Married 22 years. 2 girls- college and DTR in grad school. 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   Social Determinants of Health   Financial Resource Strain:   .  Difficulty of Paying Living Expenses:   Food Insecurity:   . Worried About Charity fundraiser in the Last Year:   . Arboriculturist in the Last Year:   Transportation Needs:   . Film/video editor (Medical):   Marland Kitchen Lack of Transportation (Non-Medical):   Physical Activity:   . Days of Exercise per Week:   . Minutes of Exercise per Session:   Stress:   . Feeling of Stress :   Social Connections:   . Frequency of Communication with Friends and Family:   . Frequency of Social Gatherings with Friends and Family:   . Attends Religious Services:   . Active Member of Clubs or Organizations:   . Attends Archivist Meetings:   Marland Kitchen Marital Status:   Intimate Partner Violence:   . Fear of Current or Ex-Partner:   . Emotionally Abused:   Marland Kitchen  Physically Abused:   . Sexually Abused:     Current Outpatient Medications on File Prior to Visit  Medication Sig Dispense Refill  . cetirizine (ZYRTEC) 10 MG tablet Take 10 mg by mouth daily.    . hydrochlorothiazide (MICROZIDE) 12.5 MG capsule Take 1 capsule (12.5 mg total) by mouth daily. 90 capsule 0  . levonorgestrel-ethinyl estradiol (LARISSIA) 0.1-20 MG-MCG tablet TAKE 1 TABLET BY MOUTH EVERY DAY CONTINUOUS DOSING 84 tablet 3  . Olopatadine HCl 0.2 % SOLN     . ondansetron (ZOFRAN ODT) 4 MG disintegrating tablet Take 1 tablet (4 mg total) by mouth every 6 (six) hours as needed for nausea. 30 tablet 0   No current facility-administered medications on file prior to visit.      ROS:  Review of Systems  Constitutional: Positive for fatigue. Negative for fever and unexpected weight change.  Respiratory: Negative for cough, shortness of breath and wheezing.   Cardiovascular: Negative for chest pain, palpitations and leg swelling.  Gastrointestinal: Negative for blood in stool, constipation, diarrhea, nausea and vomiting.  Endocrine: Negative for cold intolerance, heat intolerance and polyuria.  Genitourinary: Negative for dyspareunia, dysuria, flank pain, frequency, genital sores, hematuria, menstrual problem, pelvic pain, urgency, vaginal bleeding, vaginal discharge and vaginal pain.  Musculoskeletal: Negative for arthralgias, back pain, joint swelling and myalgias.  Skin: Negative for rash.  Neurological: Negative for dizziness, syncope, light-headedness, numbness and headaches.  Hematological: Negative for adenopathy.  Psychiatric/Behavioral: Positive for agitation and dysphoric mood. Negative for confusion, sleep disturbance and suicidal ideas. The patient is not nervous/anxious.      Objective: BP 110/90   Ht 5' 7"  (1.702 m)   Wt 192 lb (87.1 kg)   LMP  (LMP Unknown)   BMI 30.07 kg/m    Physical Exam Constitutional:      Appearance: She is well-developed.    Genitourinary:     Vulva, vagina, uterus, right adnexa and left adnexa normal.     No vulval lesion or tenderness noted.     No vaginal discharge, erythema or tenderness.     No cervical motion tenderness or polyp.     Uterus is not enlarged or tender.     No right or left adnexal mass present.     Right adnexa not tender.     Left adnexa not tender.  Neck:     Thyroid: No thyromegaly.  Cardiovascular:     Rate and Rhythm: Normal rate and regular rhythm.     Heart sounds: Normal heart sounds. No murmur heard.   Pulmonary:     Effort: Pulmonary  effort is normal.     Breath sounds: Normal breath sounds.  Chest:     Breasts:        Right: No mass, nipple discharge, skin change or tenderness.        Left: No mass, nipple discharge, skin change or tenderness.  Abdominal:     Palpations: Abdomen is soft.     Tenderness: There is no abdominal tenderness. There is no guarding.  Musculoskeletal:        General: Normal range of motion.     Cervical back: Normal range of motion.  Neurological:     General: No focal deficit present.     Mental Status: She is alert and oriented to person, place, and time.     Cranial Nerves: No cranial nerve deficit.  Skin:    General: Skin is warm and dry.  Psychiatric:        Mood and Affect: Mood normal.        Behavior: Behavior normal.        Thought Content: Thought content normal.        Judgment: Judgment normal.  Vitals reviewed.     Assessment/Plan: Encounter for annual routine gynecological examination  Encounter for surveillance of contraceptive pills - OCP RF. F/u prn.   Menorrhagia with regular cycle - Plan: Drospirenone (SLYND) 4 MG TABS; controlled with slynd. F/u prn.   Encounter for screening mammogram for malignant neoplasm of breast - Plan: MM 3D SCREEN BREAST BILATERAL; pt to sched mammo  Grief reaction - Plan: sertraline (ZOLOFT) 50 MG tablet, can start weaning down on sertraline to see if 50 mg dose will hold her sx. Rx  eRxd. F/u prn.   Screening for colon cancer - Plan: Cologuard ref placed. Discussed scr colonoscopy too, but pt prefers to do cologuard.   Meds ordered this encounter  Medications  . sertraline (ZOLOFT) 50 MG tablet    Sig: Take 1-2 tabs daily    Dispense:  180 tablet    Refill:  3    Order Specific Question:   Supervising Provider    Answer:   Gae Dry [id number]  . Drospirenone (SLYND) 4 MG TABS    Sig: Take 1 tablet by mouth daily.    Dispense:  84 tablet    Refill:  3    Order Specific Question:   Supervising Provider    Answer:   Gae Dry [[id number]]          GYN counsel breast self exam, mammography screening, adequate intake of calcium and vitamin D, diet and exercise     F/U  Return in about 1 year (around 11/05/2020).  Emmarose Klinke B. Suede Greenawalt, PA-C 11/06/2019 2:40 PM

## 2019-11-06 ENCOUNTER — Encounter: Payer: Self-pay | Admitting: Obstetrics and Gynecology

## 2019-11-06 ENCOUNTER — Other Ambulatory Visit: Payer: Self-pay

## 2019-11-06 ENCOUNTER — Ambulatory Visit (INDEPENDENT_AMBULATORY_CARE_PROVIDER_SITE_OTHER): Payer: BC Managed Care – PPO | Admitting: Obstetrics and Gynecology

## 2019-11-06 VITALS — BP 110/90 | Ht 67.0 in | Wt 192.0 lb

## 2019-11-06 DIAGNOSIS — Z3041 Encounter for surveillance of contraceptive pills: Secondary | ICD-10-CM

## 2019-11-06 DIAGNOSIS — N920 Excessive and frequent menstruation with regular cycle: Secondary | ICD-10-CM | POA: Diagnosis not present

## 2019-11-06 DIAGNOSIS — Z1211 Encounter for screening for malignant neoplasm of colon: Secondary | ICD-10-CM

## 2019-11-06 DIAGNOSIS — Z1231 Encounter for screening mammogram for malignant neoplasm of breast: Secondary | ICD-10-CM

## 2019-11-06 DIAGNOSIS — Z01419 Encounter for gynecological examination (general) (routine) without abnormal findings: Secondary | ICD-10-CM | POA: Diagnosis not present

## 2019-11-06 DIAGNOSIS — F4321 Adjustment disorder with depressed mood: Secondary | ICD-10-CM

## 2019-11-06 MED ORDER — SERTRALINE HCL 50 MG PO TABS: ORAL_TABLET | ORAL | 3 refills | Status: DC

## 2019-11-06 MED ORDER — SLYND 4 MG PO TABS: 1.0000 | ORAL_TABLET | Freq: Every day | ORAL | 3 refills | Status: DC

## 2019-11-06 NOTE — Patient Instructions (Addendum)
I value your feedback and entrusting [place] with your care. If you get a [facility] patient survey, I would appreciate you taking the time to let us know about your experience today. Thank you!  As of April 26, 2019, your lab results will be released to your MyChart immediately, before I even have a chance to see them. Please give me time to review them and contact you if there are any abnormalities. Thank you for your patience.   [place] [facility]: [phone]

## 2019-11-20 ENCOUNTER — Other Ambulatory Visit: Payer: Self-pay | Admitting: Nurse Practitioner

## 2019-11-21 ENCOUNTER — Encounter: Payer: Self-pay | Admitting: Obstetrics and Gynecology

## 2019-11-22 LAB — COLOGUARD: Cologuard: NEGATIVE

## 2019-11-27 LAB — COLOGUARD: COLOGUARD: NEGATIVE

## 2019-11-27 LAB — EXTERNAL GENERIC LAB PROCEDURE: COLOGUARD: NEGATIVE

## 2019-11-28 ENCOUNTER — Encounter: Payer: Self-pay | Admitting: Obstetrics and Gynecology

## 2019-11-28 ENCOUNTER — Telehealth: Payer: Self-pay

## 2019-11-28 NOTE — Telephone Encounter (Signed)
Called pt to let her know NEGATIVE Cologuard test results, no answer, [facility].

## 2019-11-28 NOTE — Telephone Encounter (Signed)
Pt aware.

## 2019-11-29 NOTE — Progress Notes (Signed)
Office Visit Note  Patient: Lori Dawson             Date of Birth: [birth year]           MRN: [id number]             PCP: Marval Regal, NP Referring: Marval Regal, NP Visit Date: 12/12/2019 Occupation: Accountant  Subjective:  Pain in multiple joints.   History of Present Illness: Lori Dawson is a 44 y.o. female seen in consultation per request of her PCP.  According to the patient her symptoms a started 1 year ago with generalized pain.  She states the pain could be intense.  Her pain can range anywhere from 1-8 on the scale of 0-10.  She states she started taking Excedrin every morning which was helpful but she stopped 2 months ago per recommendation of her PCP.  She continues to be a lot of discomfort.  She states she has a stiffness and pain in her hands which lasts almost all day.  She describes pain and discomfort in her bilateral hands, bilateral hip joints, bilateral knees and her feet.  She has noticed intermittent swelling in her hands.  She states she is unable to wear her rings for the last 1 year.  She was diagnosed with degenerative disc disease when she was a teenager.  She states she has had right-sided radiculopathy for which she has had injections in the past.  She has intermittent flare of her lower back discomfort.  She also gives history and of insomnia for the last 3 to 4 years.  She sometimes have nocturnal pain.  She lost her brother 2 years ago from heart attack and had been through a stressful time.  There is family history of lupus in her first cousin.  She is gravida 2, para 2, miscarriages 0.  There is no history of nasal ulcers, sicca symptoms, Raynaud's phenomenon, photosensitivity or lymphadenopathy.  Activities of Daily Living:  Patient reports morning stiffness for a couple of hours.   Patient Denies nocturnal pain.  Difficulty dressing/grooming: Denies Difficulty climbing stairs: Denies Difficulty getting out of chair: Denies Difficulty using  hands for taps, buttons, cutlery, and/or writing: Denies  Review of Systems  Constitutional: Positive for fatigue. Negative for night sweats, weight gain and weight loss.  HENT: Positive for mouth sores. Negative for trouble swallowing, trouble swallowing, mouth dryness and nose dryness.   Eyes: Negative for pain, redness, itching, visual disturbance and dryness.  Respiratory: Negative for cough, shortness of breath and difficulty breathing.   Cardiovascular: Negative for chest pain, palpitations, hypertension, irregular heartbeat and swelling in legs/feet.  Gastrointestinal: Negative for blood in stool, constipation and diarrhea.  Endocrine: Negative for increased urination.  Genitourinary: Negative for difficulty urinating and vaginal dryness.  Musculoskeletal: Positive for arthralgias, joint pain, joint swelling and morning stiffness. Negative for myalgias, muscle weakness, muscle tenderness and myalgias.  Skin: Negative for color change, rash, hair loss, redness, skin tightness, ulcers and sensitivity to sunlight.  Allergic/Immunologic: Negative for susceptible to infections.  Neurological: Positive for numbness and headaches. Negative for dizziness, memory loss, night sweats and weakness.  Hematological: Negative for bruising/bleeding tendency and swollen glands.  Psychiatric/Behavioral: Positive for depressed mood and sleep disturbance. Negative for confusion. The patient is nervous/anxious.     PMFS History:  Patient Active Problem List   Diagnosis Date Noted  . Menorrhagia with regular cycle 11/06/2019  . Generalized body aches 09/15/2019  . Environmental and seasonal allergies 09/01/2019  .  Low back pain 08/09/2019  . Overweight (BMI 25.0-29.9) 08/09/2019  . HTN (hypertension) 08/09/2019  . Anxiety and depression 12/29/2017  . Family history of myocardial infarction in first degree female relative 08/15/2017  . DUB (dysfunctional uterine bleeding) 08/15/2017    Past Medical  History:  Diagnosis Date  . Abnormal mammogram 11/10/2016  . Allergy    seasonal  . Atypical chest pain 09/22/2017  . 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 Environmental and seasonal allergies 09/01/2019  . Herniated lumbar intervertebral disc   . HTN (hypertension) 08/09/2019  . Low back pain 08/09/2019  . Mass of lower inner quadrant of left breast 11/17/2016  . Migraine headache   . Overweight (BMI 25.0-29.9) 08/09/2019  . Palpitations 09/22/2017  . Screening for colon cancer 10/2019   neg Cologuard; repeat due in 3 yrs    Family History  Problem Relation Age of Onset  . Cervical cancer Maternal Aunt        30s/40s  . Diabetes Paternal Grandmother   . Lung cancer Paternal Grandmother   . Lung cancer Paternal Grandfather   . Thyroid disease Mother   . Arthritis Mother   . Hypertension Father   . Hyperlipidemia Father   . Arthritis Father   . Heart attack Brother 76  . Coronary artery disease Brother   . COPD Brother   . Coronary artery disease Maternal Grandfather   . Anxiety disorder Daughter   . Healthy Daughter   . Anxiety disorder Daughter   . Healthy Daughter   . Breast cancer Neg Hx    Past Surgical History:  Procedure Laterality Date  . AUGMENTATION MAMMAPLASTY  2004   in [place], right breast was larger  . BREAST BIOPSY Left 11/18/2016   core bx Byrnett, neg  . TUBAL LIGATION  2006   Social History   Social History Narrative    Married 22 years. 2 girls- college and DTR in grad school. 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   Immunization History  Administered Date(s) Administered  . Influenza,inj,Quad PF,6+ Mos 04/18/2018, 03/19/2019  . Moderna SARS-COVID-2 Vaccination 08/02/2019, 08/31/2019  . Tdap 08/14/2017     Objective: Vital Signs: BP (!) 130/83 (BP Location: Right Arm, Patient Position: Sitting, Cuff Size: Normal)   Pulse 62   Resp 14   Ht 5' 6.5" (1.689 m)   Wt 194 lb (88 kg)   BMI 30.84 kg/m    Physical Exam Vitals and  nursing note reviewed.  Constitutional:      Appearance: She is well-developed.  HENT:     Head: Normocephalic and atraumatic.  Eyes:     Conjunctiva/sclera: Conjunctivae normal.  Cardiovascular:     Rate and Rhythm: Normal rate and regular rhythm.     Heart sounds: Normal heart sounds.  Pulmonary:     Effort: Pulmonary effort is normal.     Breath sounds: Normal breath sounds.  Abdominal:     General: Bowel sounds are normal.     Palpations: Abdomen is soft.  Musculoskeletal:     Cervical back: Normal range of motion.  Lymphadenopathy:     Cervical: No cervical adenopathy.  Skin:    General: Skin is warm and dry.     Capillary Refill: Capillary refill takes less than 2 seconds.  Neurological:     Mental Status: She is alert and oriented to person, place, and time.  Psychiatric:        Behavior: Behavior normal.  Musculoskeletal Exam: C-spine was in good range of motion.  Thoracic spine was in good range of motion without discomfort.  She has discomfort range of motion of her lumbar spine.  She had mild SI joint tenderness.  Shoulder joints, elbow joints, wrist joints with good range of motion.  She is some tenderness across her PIP joints.  Mild PIP and DIP thickening was noted.  She had discomfort range of motion of bilateral hip joints.  She had bilateral trochanteric bursitis.  She has discomfort range of motion of her knee joints without any warmth swelling or effusion.  She had bilateral pes planus.  Tenderness across MTPs was noted but no synovitis was noted.  CDAI Exam: CDAI Score: -- Patient Global: --; Provider Global: -- Swollen: --; Tender: -- Joint Exam 12/12/2019   No joint exam has been documented for this visit   There is currently no information documented on the homunculus. Go to the Rheumatology activity and complete the homunculus joint exam.  Investigation: No additional findings.  Imaging: XR HIPS BILAT W OR W/O PELVIS 3-4 VIEWS  Result Date:  12/12/2019 No hip joint narrowing was noted.  No SI joint narrowing or sclerosis was noted. Impression: Unremarkable x-ray of bilateral hip joints.  XR Foot 2 Views Left  Result Date: 12/12/2019 First MTP, PIP and DIP narrowing was noted.  No other MTPs, intertarsal or tibiotalar joint space narrowing was noted.  No erosive changes were noted. Impression: These findings are consistent with early osteoarthritic changes.  XR Foot 2 Views Right  Result Date: 12/12/2019 PIP and DIP narrowing was noted.  No MTP, intertarsal or tibiotalar joint space narrowing was noted.  No erosive changes were noted. Impression: These findings are consistent with early osteoarthritic changes.  XR Hand 2 View Left  Result Date: 12/12/2019 CMC, PIP and DIP narrowing was noted.  No MCP, intercarpal or radiocarpal joint space narrowing was noted.  No erosive changes were noted. Impression: These findings are consistent with osteoarthritis of the hand.  XR Hand 2 View Right  Result Date: 12/12/2019 CMC, PIP and DIP narrowing was noted.  No MCP, intercarpal or radiocarpal joint space narrowing was noted.  No erosive changes were noted. Impression: These findings are consistent with osteoarthritis of the hand.  XR KNEE 3 VIEW LEFT  Result Date: 12/12/2019 Moderate medial compartment narrowing was noted.  Moderate patellofemoral narrowing was noted.  No chondrocalcinosis was noted. Impression: These findings are consistent with moderate osteoarthritis and moderate chondromalacia patella.  XR KNEE 3 VIEW RIGHT  Result Date: 12/12/2019 Moderate medial compartment narrowing was noted.  No chondrocalcinosis was noted.  Moderate patellofemoral narrowing was noted. Impression: These findings are consistent moderate osteoarthritis and moderate chondromalacia patella.   Recent Labs: Lab Results  Component Value Date   WBC 6.9 09/13/2019   HGB 13.3 09/13/2019   PLT 282.0 09/13/2019   NA 137 09/13/2019   K 3.7 09/13/2019    CL 104 09/13/2019   CO2 26 09/13/2019   GLUCOSE 93 09/13/2019   BUN 19 09/13/2019   CREATININE 0.80 09/13/2019   BILITOT 0.5 08/09/2019   ALKPHOS 62 08/09/2019   AST 22 08/09/2019   ALT 19 08/09/2019   PROT 7.5 08/09/2019   ALBUMIN 4.1 08/09/2019   CALCIUM 8.9 09/13/2019   GFRAA 107 10/19/2018   QFTBGOLDPLUS Negative 10/19/2018    Speciality Comments: No specialty comments available.  Procedures:  No procedures performed Allergies: Levofloxacin   Assessment / Plan:     Visit Diagnoses: Pain  in both hands -patient complains of pain and stiffness in her bilateral hands.  She also gives history of intermittent swelling.  No synovitis was noted.  Plan: XR Hand 2 View Right, XR Hand 2 View Left, x-ray findings are consistent with early osteoarthritis.  The x-ray findings were discussed with the patient.  I will obtain following labs to look for any underlying autoimmune process.  Rheumatoid factor, Cyclic citrul peptide antibody, IgG, 14-3-3 eta Protein, ANA  Chronic pain of both hips -she complains of pain in bilateral hip joints.  She had good range of motion.  Plan: XR HIPS BILAT W OR W/O PELVIS 3-4 VIEWS.  X-rays obtained today were unremarkable.  Trochanteric bursitis of both hips-she had tenderness on palpation of bilateral trochanteric bursa.  IT band exercises handout was given.  Chronic pain of both knees -she complains of pain in her bilateral knee joints.  Plan: XR KNEE 3 VIEW RIGHT, XR KNEE 3 VIEW LEFT, x-ray showed bilateral mild to moderate osteoarthritis and moderate chondromalacia patella.  Weight loss diet and exercise was emphasized.  A handout on knee exercises was given.  HLA-B27 antigen  Pain in both feet -she complains of discomfort in her bilateral feet.  Plan: XR Foot 2 Views Right, XR Foot 2 Views Left.  X-ray showed early osteoarthritic changes.  Proper fitting shoes with arch support were discussed.  Pes planus of both feet-she has pes planus which could be  contributing to a lot of her symptoms in her feet and her knee joints.  Arch support will help.  DDD (degenerative disc disease), lumbar-patient states history of degenerative disc disease of her lumbar spine since she was a teenager.  She has had injections in the past.  Core muscle strengthening was emphasized.  Generalized body aches -she denies any muscular pain.  I will obtain CK level today.  09/13/19: ESR 44, ANA-, CRP<1 - Plan: CK  Elevated sed rate - Plan: Repeat sedimentation rate  Essential hypertension-her blood pressure is elevated.  Anxiety and depression-she has history of anxiety depression and panic attacks.  She is taking medications.  She has been under a lot of stress.  Environmental and seasonal allergies  Family history of myocardial infarction in first degree female relative-she lost her brother from MI 2 years ago.  Orders: Orders Placed This Encounter  Procedures  . XR HIPS BILAT W OR W/O PELVIS 3-4 VIEWS  . XR KNEE 3 VIEW RIGHT  . XR KNEE 3 VIEW LEFT  . XR Hand 2 View Right  . XR Hand 2 View Left  . XR Foot 2 Views Right  . XR Foot 2 Views Left  . CK  . Sedimentation rate  . Rheumatoid factor  . Cyclic citrul peptide antibody, IgG  . 14-3-3 eta Protein  . ANA  . HLA-B27 antigen   No orders of the defined types were placed in this encounter.     Follow-Up Instructions: Return for Pain in multiple joints.   Bo Merino, MD  Note - This record has been created using Editor, commissioning.  Chart creation errors have been sought, but may not always  have been located. Such creation errors do not reflect on  the standard of medical care.

## 2019-12-12 ENCOUNTER — Ambulatory Visit: Payer: Self-pay

## 2019-12-12 ENCOUNTER — Other Ambulatory Visit: Payer: Self-pay

## 2019-12-12 ENCOUNTER — Ambulatory Visit: Payer: BC Managed Care – PPO | Admitting: Rheumatology

## 2019-12-12 ENCOUNTER — Encounter: Payer: Self-pay | Admitting: Rheumatology

## 2019-12-12 VITALS — BP 130/83 | HR 62 | Resp 14 | Ht 66.5 in | Wt 194.0 lb

## 2019-12-12 DIAGNOSIS — Z8249 Family history of ischemic heart disease and other diseases of the circulatory system: Secondary | ICD-10-CM

## 2019-12-12 DIAGNOSIS — M79672 Pain in left foot: Secondary | ICD-10-CM | POA: Diagnosis not present

## 2019-12-12 DIAGNOSIS — M25561 Pain in right knee: Secondary | ICD-10-CM | POA: Diagnosis not present

## 2019-12-12 DIAGNOSIS — R700 Elevated erythrocyte sedimentation rate: Secondary | ICD-10-CM

## 2019-12-12 DIAGNOSIS — M7062 Trochanteric bursitis, left hip: Secondary | ICD-10-CM

## 2019-12-12 DIAGNOSIS — F419 Anxiety disorder, unspecified: Secondary | ICD-10-CM

## 2019-12-12 DIAGNOSIS — M25552 Pain in left hip: Secondary | ICD-10-CM

## 2019-12-12 DIAGNOSIS — M7061 Trochanteric bursitis, right hip: Secondary | ICD-10-CM | POA: Diagnosis not present

## 2019-12-12 DIAGNOSIS — G8929 Other chronic pain: Secondary | ICD-10-CM

## 2019-12-12 DIAGNOSIS — R52 Pain, unspecified: Secondary | ICD-10-CM

## 2019-12-12 DIAGNOSIS — M79641 Pain in right hand: Secondary | ICD-10-CM | POA: Diagnosis not present

## 2019-12-12 DIAGNOSIS — M79642 Pain in left hand: Secondary | ICD-10-CM

## 2019-12-12 DIAGNOSIS — F329 Major depressive disorder, single episode, unspecified: Secondary | ICD-10-CM

## 2019-12-12 DIAGNOSIS — M25551 Pain in right hip: Secondary | ICD-10-CM

## 2019-12-12 DIAGNOSIS — M5136 Other intervertebral disc degeneration, lumbar region: Secondary | ICD-10-CM

## 2019-12-12 DIAGNOSIS — M2142 Flat foot [pes planus] (acquired), left foot: Secondary | ICD-10-CM

## 2019-12-12 DIAGNOSIS — M25562 Pain in left knee: Secondary | ICD-10-CM | POA: Diagnosis not present

## 2019-12-12 DIAGNOSIS — I10 Essential (primary) hypertension: Secondary | ICD-10-CM

## 2019-12-12 DIAGNOSIS — J3089 Other allergic rhinitis: Secondary | ICD-10-CM

## 2019-12-12 DIAGNOSIS — M51369 Other intervertebral disc degeneration, lumbar region without mention of lumbar back pain or lower extremity pain: Secondary | ICD-10-CM

## 2019-12-12 DIAGNOSIS — M2141 Flat foot [pes planus] (acquired), right foot: Secondary | ICD-10-CM

## 2019-12-12 DIAGNOSIS — F32A Depression, unspecified: Secondary | ICD-10-CM

## 2019-12-12 DIAGNOSIS — M79671 Pain in right foot: Secondary | ICD-10-CM

## 2019-12-12 NOTE — Patient Instructions (Signed)
Iliotibial Band Syndrome Rehab Ask your health care provider which exercises are safe for you. Do exercises exactly as told by your health care provider and adjust them as directed. It is normal to feel mild stretching, pulling, tightness, or discomfort as you do these exercises. Stop right away if you feel sudden pain or your pain gets significantly worse. Do not begin these exercises until told by your health care provider. Stretching and range-of-motion exercises These exercises warm up your muscles and joints and improve the movement and flexibility of your hip and pelvis. Quadriceps stretch, prone  1. Lie on your abdomen on a firm surface, such as a bed or padded floor (prone position). 2. Bend your left / right knee and reach back to hold your ankle or pant leg. If you cannot reach your ankle or pant leg, loop a belt around your foot and grab the belt instead. 3. Gently pull your heel toward your buttocks. Your knee should not slide out to the side. You should feel a stretch in the front of your thigh and knee (quadriceps). 4. Hold this position for __________ seconds. Repeat __________ times. Complete this exercise __________ times a day. Iliotibial band stretch An iliotibial band is a strong band of muscle tissue that runs from the outer side of your hip to the outer side of your thigh and knee. 1. Lie on your side with your left / right leg in the top position. 2. Bend both of your knees and grab your left / right ankle. Stretch out your bottom arm to help you balance. 3. Slowly bring your top knee back so your thigh goes behind your trunk. 4. Slowly lower your top leg toward the floor until you feel a gentle stretch on the outside of your left / right hip and thigh. If you do not feel a stretch and your knee will not fall farther, place the heel of your other foot on top of your knee and pull your knee down toward the floor with your foot. 5. Hold this position for __________  seconds. Repeat __________ times. Complete this exercise __________ times a day. Strengthening exercises These exercises build strength and endurance in your hip and pelvis. Endurance is the ability to use your muscles for a long time, even after they get tired. Straight leg raises, side-lying This exercise strengthens the muscles that rotate the leg at the hip and move it away from your body (hip abductors). 1. Lie on your side with your left / right leg in the top position. Lie so your head, shoulder, hip, and knee line up. You may bend your bottom knee to help you balance. 2. Roll your hips slightly forward so your hips are stacked directly over each other and your left / right knee is facing forward. 3. Tense the muscles in your outer thigh and lift your top leg 4-6 inches (10-15 cm). 4. Hold this position for __________ seconds. 5. Slowly return to the starting position. Let your muscles relax completely before doing another repetition. Repeat __________ times. Complete this exercise __________ times a day. Leg raises, prone This exercise strengthens the muscles that move the hips (hip extensors). 1. Lie on your abdomen on your bed or a firm surface. You can put a pillow under your hips if that is more comfortable for your lower back. 2. Bend your left / right knee so your foot is straight up in the air. 3. Squeeze your buttocks muscles and lift your left / right thigh  off the bed. Do not let your back arch. 4. Tense your thigh muscle as hard as you can without increasing any knee pain. 5. Hold this position for __________ seconds. 6. Slowly lower your leg to the starting position and allow it to relax completely. Repeat __________ times. Complete this exercise __________ times a day. Hip hike 1. Stand sideways on a bottom step. Stand on your left / right leg with your other foot unsupported next to the step. You can hold on to the railing or wall for balance if needed. 2. Keep your knees  straight and your torso square. Then lift your left / right hip up toward the ceiling. 3. Slowly let your left / right hip lower toward the floor, past the starting position. Your foot should get closer to the floor. Do not lean or bend your knees. Repeat __________ times. Complete this exercise __________ times a day. This information is not intended to replace advice given to you by your health care provider. Make sure you discuss any questions you have with your health care provider. Document Revised: 08/24/2018 Document Reviewed: 02/22/2018 Elsevier Patient Education  2020 [employer], 15(4), [phone]. Retrieved February 20, 2018 from [email or web address].com/[email or web address]">  Knee Exercises Ask your health care provider which exercises are safe for you. Do exercises exactly as told by your health care provider and adjust them as directed. It is normal to feel mild stretching, pulling, tightness, or discomfort as you do these exercises. Stop right away if you feel sudden pain or your pain gets worse. Do not begin these exercises until told by your health care provider. Stretching and range-of-motion exercises These exercises warm up your muscles and joints and improve the movement and flexibility of your knee. These exercises also help to relieve pain and swelling. Knee extension, prone 5. Lie on your abdomen (prone position) on a bed. 6. Place your left / right knee just beyond the edge of the surface so your knee is not on the bed. You can put a towel under your left / right thigh just above your kneecap for comfort. 7. Relax your leg muscles and allow gravity to straighten your knee (extension). You should feel a stretch behind your left / right knee. 8. Hold this position for __________ seconds. 9. Scoot up so your knee is supported between repetitions. Repeat __________ times. Complete this exercise __________ times a day. Knee flexion, active  6. Lie on your back  with both legs straight. If this causes back discomfort, bend your left / right knee so your foot is flat on the floor. 7. Slowly slide your left / right heel back toward your buttocks. Stop when you feel a gentle stretch in the front of your knee or thigh (flexion). 8. Hold this position for __________ seconds. 9. Slowly slide your left / right heel back to the starting position. Repeat __________ times. Complete this exercise __________ times a day. Quadriceps stretch, prone  6. Lie on your abdomen on a firm surface, such as a bed or padded floor. 7. Bend your left / right knee and hold your ankle. If you cannot reach your ankle or pant leg, loop a belt around your foot and grab the belt instead. 8. Gently pull your heel toward your buttocks. Your knee should not slide out to the side. You should feel a stretch in the front of your thigh and knee (quadriceps). 9. Hold this position for __________ seconds. Repeat __________ times. Complete this exercise __________  times a day. Hamstring, supine 7. Lie on your back (supine position). 8. Loop a belt or towel over the ball of your left / right foot. The ball of your foot is on the walking surface, right under your toes. 9. Straighten your left / right knee and slowly pull on the belt to raise your leg until you feel a gentle stretch behind your knee (hamstring). ? Do not let your knee bend while you do this. ? Keep your other leg flat on the floor. 10. Hold this position for __________ seconds. Repeat __________ times. Complete this exercise __________ times a day. Strengthening exercises These exercises build strength and endurance in your knee. Endurance is the ability to use your muscles for a long time, even after they get tired. Quadriceps, isometric This exercise stretches the muscles in front of your thigh (quadriceps) without moving your knee joint (isometric). 4. Lie on your back with your left / right leg extended and your other knee  bent. Put a rolled towel or small pillow under your knee if told by your health care provider. 5. Slowly tense the muscles in the front of your left / right thigh. You should see your kneecap slide up toward your hip or see increased dimpling just above the knee. This motion will push the back of the knee toward the floor. 6. For __________ seconds, hold the muscle as tight as you can without increasing your pain. 7. Relax the muscles slowly and completely. Repeat __________ times. Complete this exercise __________ times a day. Straight leg raises This exercise stretches the muscles in front of your thigh (quadriceps) and the muscles that move your hips (hip flexors). 1. Lie on your back with your left / right leg extended and your other knee bent. 2. Tense the muscles in the front of your left / right thigh. You should see your kneecap slide up or see increased dimpling just above the knee. Your thigh may even shake a bit. 3. Keep these muscles tight as you raise your leg 4-6 inches (10-15 cm) off the floor. Do not let your knee bend. 4. Hold this position for __________ seconds. 5. Keep these muscles tense as you lower your leg. 6. Relax your muscles slowly and completely after each repetition. Repeat __________ times. Complete this exercise __________ times a day. Hamstring, isometric 1. Lie on your back on a firm surface. 2. Bend your left / right knee about __________ degrees. 3. Dig your left / right heel into the surface as if you are trying to pull it toward your buttocks. Tighten the muscles in the back of your thighs (hamstring) to "dig" as hard as you can without increasing any pain. 4. Hold this position for __________ seconds. 5. Release the tension gradually and allow your muscles to relax completely for __________ seconds after each repetition. Repeat __________ times. Complete this exercise __________ times a day. Hamstring curls If told by your health care provider, do this  exercise while wearing ankle weights. Begin with __________ lb weights. Then increase the weight by 1 lb (0.5 kg) increments. Do not wear ankle weights that are more than __________ lb. 1. Lie on your abdomen with your legs straight. 2. Bend your left / right knee as far as you can without feeling pain. Keep your hips flat against the floor. 3. Hold this position for __________ seconds. 4. Slowly lower your leg to the starting position. Repeat __________ times. Complete this exercise __________ times a day. Squats This exercise  strengthens the muscles in front of your thigh and knee (quadriceps). 1. Stand in front of a table, with your feet and knees pointing straight ahead. You may rest your hands on the table for balance but not for support. 2. Slowly bend your knees and lower your hips like you are going to sit in a chair. ? Keep your weight over your heels, not over your toes. ? Keep your lower legs upright so they are parallel with the table legs. ? Do not let your hips go lower than your knees. ? Do not bend lower than told by your health care provider. ? If your knee pain increases, do not bend as low. 3. Hold the squat position for __________ seconds. 4. Slowly push with your legs to return to standing. Do not use your hands to pull yourself to standing. Repeat __________ times. Complete this exercise __________ times a day. Wall slides This exercise strengthens the muscles in front of your thigh and knee (quadriceps). 1. Lean your back against a smooth wall or door, and walk your feet out 18-24 inches (46-61 cm) from it. 2. Place your feet hip-width apart. 3. Slowly slide down the wall or door until your knees bend __________ degrees. Keep your knees over your heels, not over your toes. Keep your knees in line with your hips. 4. Hold this position for __________ seconds. Repeat __________ times. Complete this exercise __________ times a day. Straight leg raises This exercise  strengthens the muscles that rotate the leg at the hip and move it away from your body (hip abductors). 1. Lie on your side with your left / right leg in the top position. Lie so your head, shoulder, knee, and hip line up. You may bend your bottom knee to help you keep your balance. 2. Roll your hips slightly forward so your hips are stacked directly over each other and your left / right knee is facing forward. 3. Leading with your heel, lift your top leg 4-6 inches (10-15 cm). You should feel the muscles in your outer hip lifting. ? Do not let your foot drift forward. ? Do not let your knee roll toward the ceiling. 4. Hold this position for __________ seconds. 5. Slowly return your leg to the starting position. 6. Let your muscles relax completely after each repetition. Repeat __________ times. Complete this exercise __________ times a day. Straight leg raises This exercise stretches the muscles that move your hips away from the front of the pelvis (hip extensors). 1. Lie on your abdomen on a firm surface. You can put a pillow under your hips if that is more comfortable. 2. Tense the muscles in your buttocks and lift your left / right leg about 4-6 inches (10-15 cm). Keep your knee straight as you lift your leg. 3. Hold this position for __________ seconds. 4. Slowly lower your leg to the starting position. 5. Let your leg relax completely after each repetition. Repeat __________ times. Complete this exercise __________ times a day. This information is not intended to replace advice given to you by your health care provider. Make sure you discuss any questions you have with your health care provider. Document Revised: 02/21/2018 Document Reviewed: 02/21/2018 Else[employer] Patient Education  [place]. Hand Exercises Hand exercises can be helpful for almost anyone. These exercises can strengthen the hands, improve flexibility and movement, and increase blood flow to the hands. These results  can make work and daily tasks easier. Hand exercises can be especially helpful for  people who have joint pain from arthritis or have nerve damage from overuse (carpal tunnel syndrome). These exercises can also help people who have injured a hand. Exercises Most of these hand exercises are gentle stretching and motion exercises. It is usually safe to do them often throughout the day. Warming up your hands before exercise may help to reduce stiffness. You can do this with gentle massage or by placing your hands in warm water for 10-15 minutes. It is normal to feel some stretching, pulling, tightness, or mild discomfort as you begin new exercises. This will gradually improve. Stop an exercise right away if you feel sudden, severe pain or your pain gets worse. Ask your health care provider which exercises are best for you. Knuckle bend or "claw" fist 1. Stand or sit with your arm, hand, and all five fingers pointed straight up. Make sure to keep your wrist straight during the exercise. 2. Gently bend your fingers down toward your palm until the tips of your fingers are touching the top of your palm. Keep your big knuckle straight and just bend the small knuckles in your fingers. 3. Hold this position for __________ seconds. 4. Straighten (extend) your fingers back to the starting position. Repeat this exercise 5-10 times with each hand. Full finger fist 1. Stand or sit with your arm, hand, and all five fingers pointed straight up. Make sure to keep your wrist straight during the exercise. 2. Gently bend your fingers into your palm until the tips of your fingers are touching the middle of your palm. 3. Hold this position for __________ seconds. 4. Extend your fingers back to the starting position, stretching every joint fully. Repeat this exercise 5-10 times with each hand. Straight fist 1. Stand or sit with your arm, hand, and all five fingers pointed straight up. Make sure to keep your wrist straight  during the exercise. 2. Gently bend your fingers at the big knuckle, where your fingers meet your hand, and the middle knuckle. Keep the knuckle at the tips of your fingers straight and try to touch the bottom of your palm. 3. Hold this position for __________ seconds. 4. Extend your fingers back to the starting position, stretching every joint fully. Repeat this exercise 5-10 times with each hand. Tabletop 1. Stand or sit with your arm, hand, and all five fingers pointed straight up. Make sure to keep your wrist straight during the exercise. 2. Gently bend your fingers at the big knuckle, where your fingers meet your hand, as far down as you can while keeping the small knuckles in your fingers straight. Think of forming a tabletop with your fingers. 3. Hold this position for __________ seconds. 4. Extend your fingers back to the starting position, stretching every joint fully. Repeat this exercise 5-10 times with each hand. Finger spread 1. Place your hand flat on a table with your palm facing down. Make sure your wrist stays straight as you do this exercise. 2. Spread your fingers and thumb apart from each other as far as you can until you feel a gentle stretch. Hold this position for __________ seconds. 3. Bring your fingers and thumb tight together again. Hold this position for __________ seconds. Repeat this exercise 5-10 times with each hand. Making circles 1. Stand or sit with your arm, hand, and all five fingers pointed straight up. Make sure to keep your wrist straight during the exercise. 2. Make a circle by touching the tip of your thumb to the tip of your  index finger. 3. Hold for __________ seconds. Then open your hand wide. 4. Repeat this motion with your thumb and each finger on your hand. Repeat this exercise 5-10 times with each hand. Thumb motion 1. Sit with your forearm resting on a table and your wrist straight. Your thumb should be facing up toward the ceiling. Keep your  fingers relaxed as you move your thumb. 2. Lift your thumb up as high as you can toward the ceiling. Hold for __________ seconds. 3. Bend your thumb across your palm as far as you can, reaching the tip of your thumb for the small finger (pinkie) side of your palm. Hold for __________ seconds. Repeat this exercise 5-10 times with each hand. Grip strengthening  1. Hold a stress ball or other soft ball in the middle of your hand. 2. Slowly increase the pressure, squeezing the ball as much as you can without causing pain. Think of bringing the tips of your fingers into the middle of your palm. All of your finger joints should bend when doing this exercise. 3. Hold your squeeze for __________ seconds, then relax. Repeat this exercise 5-10 times with each hand. Contact a health care provider if:  Your hand pain or discomfort gets much worse when you do an exercise.  Your hand pain or discomfort does not improve within 2 hours after you exercise. If you have any of these problems, stop doing these exercises right away. Do not do them again unless your health care provider says that you can. Get help right away if:  You develop sudden, severe hand pain or swelling. If this happens, stop doing these exercises right away. Do not do them again unless your health care provider says that you can. This information is not intended to replace advice given to you by your health care provider. Make sure you discuss any questions you have with your health care provider. Document Revised: 08/24/2018 Document Reviewed: 05/04/2018 Elsevier Patient Education  [employer].

## 2019-12-13 ENCOUNTER — Ambulatory Visit: Payer: BC Managed Care – PPO | Admitting: Rheumatology

## 2019-12-14 NOTE — Progress Notes (Signed)
I will discuss results at the fu visit.

## 2019-12-18 ENCOUNTER — Encounter: Payer: Self-pay | Admitting: Rheumatology

## 2019-12-19 LAB — 14-3-3 ETA PROTEIN: 14-3-3 eta Protein: <0.2 ng/mL (ref <0.2)

## 2019-12-19 LAB — SEDIMENTATION RATE: Sed Rate: 11 mm/h (ref 0–20)

## 2019-12-19 LAB — ANA: Anti Nuclear Antibody (ANA): NEGATIVE

## 2019-12-19 LAB — RHEUMATOID FACTOR: Rheumatoid fact SerPl-aCnc: <14 IU/mL (ref <14)

## 2019-12-19 LAB — CYCLIC CITRUL PEPTIDE ANTIBODY, IGG: Cyclic Citrullin Peptide Ab: <16 UNITS

## 2019-12-19 LAB — HLA-B27 ANTIGEN: HLA-B27 Antigen: NEGATIVE

## 2019-12-19 LAB — CK: Total CK: 95 U/L (ref 29–143)

## 2019-12-27 ENCOUNTER — Ambulatory Visit: Payer: BC Managed Care – PPO | Admitting: Nurse Practitioner

## 2019-12-30 NOTE — Progress Notes (Deleted)
Office Visit Note  Patient: Lori Dawson             Date of Birth: January 30, 1976           MRN: [id number]             PCP: Marval Regal, NP Referring: Marval Regal, NP Visit Date: 01/10/2020 Occupation: @GUAROCC @  Subjective:  No chief complaint on file.   History of Present Illness: Lori Dawson is a 44 y.o. female ***   Activities of Daily Living:  Patient reports morning stiffness for *** {minute/hour:19697}.   Patient {ACTIONS;DENIES/REPORTS:21021675::"Denies"} nocturnal pain.  Difficulty dressing/grooming: {ACTIONS;DENIES/REPORTS:21021675::"Denies"} Difficulty climbing stairs: {ACTIONS;DENIES/REPORTS:21021675::"Denies"} Difficulty getting out of chair: {ACTIONS;DENIES/REPORTS:21021675::"Denies"} Difficulty using hands for taps, buttons, cutlery, and/or writing: {ACTIONS;DENIES/REPORTS:21021675::"Denies"}  No Rheumatology ROS completed.   PMFS History:  Patient Active Problem List   Diagnosis Date Noted  . Menorrhagia with regular cycle 11/06/2019  . Generalized body aches 09/15/2019  . Environmental and seasonal allergies 09/01/2019  . Low back pain 08/09/2019  . Overweight (BMI 25.0-29.9) 08/09/2019  . HTN (hypertension) 08/09/2019  . Anxiety and depression 12/29/2017  . Family history of myocardial infarction in first degree female relative 08/15/2017  . DUB (dysfunctional uterine bleeding) 08/15/2017    Past Medical History:  Diagnosis Date  . Abnormal mammogram 11/10/2016  . Allergy    seasonal  . Atypical chest pain 09/22/2017  . 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 Environmental and seasonal allergies 09/01/2019  . Herniated lumbar intervertebral disc   . HTN (hypertension) 08/09/2019  . Low back pain 08/09/2019  . Mass of lower inner quadrant of left breast 11/17/2016  . Migraine headache   . Overweight (BMI 25.0-29.9) 08/09/2019  . Palpitations 09/22/2017  . Screening for colon cancer 10/2019   neg Cologuard; repeat due in 3 yrs    Family  History  Problem Relation Age of Onset  . Cervical cancer Maternal Aunt        30s/40s  . Diabetes Paternal Grandmother   . Lung cancer Paternal Grandmother   . Lung cancer Paternal Grandfather   . Thyroid disease Mother   . Arthritis Mother   . Hypertension Father   . Hyperlipidemia Father   . Arthritis Father   . Heart attack Brother 67  . Coronary artery disease Brother   . COPD Brother   . Coronary artery disease Maternal Grandfather   . Anxiety disorder Daughter   . Healthy Daughter   . Anxiety disorder Daughter   . Healthy Daughter   . Breast cancer Neg Hx    Past Surgical History:  Procedure Laterality Date  . AUGMENTATION MAMMAPLASTY  2004   in [place], right breast was larger  . BREAST BIOPSY Left 11/18/2016   core bx Byrnett, neg  . TUBAL LIGATION  2006   Social History   Social History Narrative    Married 22 years. 2 girls- college and DTR in grad school. 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   Immunization History  Administered Date(s) Administered  . Influenza,inj,Quad PF,6+ Mos 04/18/2018, 03/19/2019  . Moderna SARS-COVID-2 Vaccination 08/02/2019, 08/31/2019  . Tdap 08/14/2017     Objective: Vital Signs: There were no vitals taken for this visit.   Physical Exam   Musculoskeletal Exam: ***  CDAI Exam: CDAI Score: -- Patient Global: --; Provider Global: -- Swollen: --; Tender: -- Joint Exam 01/10/2020   No joint exam has been documented for this visit   There is currently no  information documented on the homunculus. Go to the Rheumatology activity and complete the homunculus joint exam.  Investigation: No additional findings.  Imaging: XR HIPS BILAT W OR W/O PELVIS 3-4 VIEWS  Result Date: 12/12/2019 No hip joint narrowing was noted.  No SI joint narrowing or sclerosis was noted. Impression: Unremarkable x-ray of bilateral hip joints.  XR Foot 2 Views Left  Result Date: 12/12/2019 First MTP, PIP and DIP narrowing was noted.   No other MTPs, intertarsal or tibiotalar joint space narrowing was noted.  No erosive changes were noted. Impression: These findings are consistent with early osteoarthritic changes.  XR Foot 2 Views Right  Result Date: 12/12/2019 PIP and DIP narrowing was noted.  No MTP, intertarsal or tibiotalar joint space narrowing was noted.  No erosive changes were noted. Impression: These findings are consistent with early osteoarthritic changes.  XR Hand 2 View Left  Result Date: 12/12/2019 CMC, PIP and DIP narrowing was noted.  No MCP, intercarpal or radiocarpal joint space narrowing was noted.  No erosive changes were noted. Impression: These findings are consistent with osteoarthritis of the hand.  XR Hand 2 View Right  Result Date: 12/12/2019 CMC, PIP and DIP narrowing was noted.  No MCP, intercarpal or radiocarpal joint space narrowing was noted.  No erosive changes were noted. Impression: These findings are consistent with osteoarthritis of the hand.  XR KNEE 3 VIEW LEFT  Result Date: 12/12/2019 Moderate medial compartment narrowing was noted.  Moderate patellofemoral narrowing was noted.  No chondrocalcinosis was noted. Impression: These findings are consistent with moderate osteoarthritis and moderate chondromalacia patella.  XR KNEE 3 VIEW RIGHT  Result Date: 12/12/2019 Moderate medial compartment narrowing was noted.  No chondrocalcinosis was noted.  Moderate patellofemoral narrowing was noted. Impression: These findings are consistent moderate osteoarthritis and moderate chondromalacia patella.   Recent Labs: Lab Results  Component Value Date   WBC 6.9 09/13/2019   HGB 13.3 09/13/2019   PLT 282.0 09/13/2019   NA 137 09/13/2019   K 3.7 09/13/2019   CL 104 09/13/2019   CO2 26 09/13/2019   GLUCOSE 93 09/13/2019   BUN 19 09/13/2019   CREATININE 0.80 09/13/2019   BILITOT 0.5 08/09/2019   ALKPHOS 62 08/09/2019   AST 22 08/09/2019   ALT 19 08/09/2019   PROT 7.5 08/09/2019    ALBUMIN 4.1 08/09/2019   CALCIUM 8.9 09/13/2019   GFRAA 107 10/19/2018   QFTBGOLDPLUS Negative 10/19/2018  December 12, 2019 CK 95, ESR 11, RF negative, anti-CCP negative, 14 3 3  eta negative, ANA negative, HLA-B27 negative  09/13/19: ESR 44, ANA-, CRP<1  Speciality Comments: No specialty comments available.  Procedures:  No procedures performed Allergies: Levofloxacin   Assessment / Plan:     Visit Diagnoses: Primary osteoarthritis of both hands - X-ray findings are consistent with early osteoarthritis.  History of intermittent swelling.  All autoimmune work-up was negative.  Chronic pain of both hips - X-rays were unremarkable.  Trochanteric bursitis of both hips - A handout on exercises was given at the last visit.  Primary osteoarthritis of both knees - Bilateral mild to moderate osteoarthritis and moderate chondromalacia patella.  Handout on exercises was given at the last visit.  Primary osteoarthritis of both feet  Pes planus of both feet  Elevated sed rate - Repeat sed rate is normal.  DDD (degenerative disc disease), lumbar - Katie gives history of injections to her lumbar spine in the past.  A handout on core muscle strengthening exercise was given at the  last visit.  Essential hypertension  Anxiety and depression  Environmental and seasonal allergies  Family history of myocardial infarction in first degree female relative  Orders: No orders of the defined types were placed in this encounter.  No orders of the defined types were placed in this encounter.   Face-to-face time spent with patient was *** minutes. Greater than 50% of time was spent in counseling and coordination of care.  Follow-Up Instructions: No follow-ups on file.   Bo Merino, MD  Note - This record has been created using Editor, commissioning.  Chart creation errors have been sought, but may not always  have been located. Such creation errors do not reflect on  the standard of medical  care.

## 2020-01-08 ENCOUNTER — Ambulatory Visit: Payer: BC Managed Care – PPO | Admitting: Rheumatology

## 2020-01-09 ENCOUNTER — Ambulatory Visit: Payer: BC Managed Care – PPO | Admitting: Rheumatology

## 2020-01-10 ENCOUNTER — Ambulatory Visit: Payer: BC Managed Care – PPO | Admitting: Rheumatology

## 2020-01-10 DIAGNOSIS — M170 Bilateral primary osteoarthritis of knee: Secondary | ICD-10-CM

## 2020-01-10 DIAGNOSIS — Z8249 Family history of ischemic heart disease and other diseases of the circulatory system: Secondary | ICD-10-CM

## 2020-01-10 DIAGNOSIS — M19071 Primary osteoarthritis, right ankle and foot: Secondary | ICD-10-CM

## 2020-01-10 DIAGNOSIS — M19041 Primary osteoarthritis, right hand: Secondary | ICD-10-CM

## 2020-01-10 DIAGNOSIS — M2141 Flat foot [pes planus] (acquired), right foot: Secondary | ICD-10-CM

## 2020-01-10 DIAGNOSIS — R700 Elevated erythrocyte sedimentation rate: Secondary | ICD-10-CM

## 2020-01-10 DIAGNOSIS — G8929 Other chronic pain: Secondary | ICD-10-CM

## 2020-01-10 DIAGNOSIS — M7061 Trochanteric bursitis, right hip: Secondary | ICD-10-CM

## 2020-01-10 DIAGNOSIS — F329 Major depressive disorder, single episode, unspecified: Secondary | ICD-10-CM

## 2020-01-10 DIAGNOSIS — J3089 Other allergic rhinitis: Secondary | ICD-10-CM

## 2020-01-10 DIAGNOSIS — I10 Essential (primary) hypertension: Secondary | ICD-10-CM

## 2020-01-10 DIAGNOSIS — M5136 Other intervertebral disc degeneration, lumbar region: Secondary | ICD-10-CM

## 2020-01-18 NOTE — Progress Notes (Signed)
Office Visit Note  Patient: Lori Dawson             Date of Birth: 06-15-1975           MRN: [id number]             PCP: Marval Regal, NP Referring: Marval Regal, NP Visit Date: 01/25/2020 Occupation: @GUAROCC @  Subjective:  Pain in multiple joints.   History of Present Illness: Lori Dawson is a 44 y.o. female with history of osteoarthritis.  She states she continues to have some pain and stiffness in her bilateral hands, her knee joints, trochanteric bursa and lower back.  She denies any joint swelling.  Activities of Daily Living:  Patient reports morning stiffness for a few  hours.   Patient Denies nocturnal pain.  Difficulty dressing/grooming: Denies Difficulty climbing stairs: Denies Difficulty getting out of chair: Denies Difficulty using hands for taps, buttons, cutlery, and/or writing: Reports  Review of Systems  Constitutional: Negative for fatigue.  HENT: Negative for mouth sores, mouth dryness and nose dryness.   Eyes: Negative for itching and dryness.  Respiratory: Negative for shortness of breath and difficulty breathing.   Cardiovascular: Negative for chest pain and palpitations.  Gastrointestinal: Negative for blood in stool, constipation and diarrhea.  Endocrine: Negative for increased urination.  Genitourinary: Negative for difficulty urinating.  Musculoskeletal: Positive for arthralgias, joint pain, muscle weakness and morning stiffness. Negative for joint swelling, myalgias, muscle tenderness and myalgias.  Skin: Negative for color change, rash, hair loss and redness.  Allergic/Immunologic: Negative for susceptible to infections.  Neurological: Positive for headaches. Negative for dizziness, numbness, memory loss and weakness.  Hematological: Negative for bruising/bleeding tendency.  Psychiatric/Behavioral: Negative for confusion.    PMFS History:  Patient Active Problem List   Diagnosis Date Noted  . Menorrhagia with regular cycle  11/06/2019  . Generalized body aches 09/15/2019  . Environmental and seasonal allergies 09/01/2019  . Low back pain 08/09/2019  . Overweight (BMI 25.0-29.9) 08/09/2019  . HTN (hypertension) 08/09/2019  . Anxiety and depression 12/29/2017  . Family history of myocardial infarction in first degree female relative 08/15/2017  . DUB (dysfunctional uterine bleeding) 08/15/2017    Past Medical History:  Diagnosis Date  . Abnormal mammogram 11/10/2016  . Allergy    seasonal  . Atypical chest pain 09/22/2017  . 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 Environmental and seasonal allergies 09/01/2019  . Herniated lumbar intervertebral disc   . HTN (hypertension) 08/09/2019  . Low back pain 08/09/2019  . Mass of lower inner quadrant of left breast 11/17/2016  . Migraine headache   . Overweight (BMI 25.0-29.9) 08/09/2019  . Palpitations 09/22/2017  . Screening for colon cancer 10/2019   neg Cologuard; repeat due in 3 yrs    Family History  Problem Relation Age of Onset  . Cervical cancer Maternal Aunt        30s/40s  . Diabetes Paternal Grandmother   . Lung cancer Paternal Grandmother   . Lung cancer Paternal Grandfather   . Thyroid disease Mother   . Arthritis Mother   . Hypertension Father   . Hyperlipidemia Father   . Arthritis Father   . Heart attack Brother 81  . Coronary artery disease Brother   . COPD Brother   . Coronary artery disease Maternal Grandfather   . Anxiety disorder Daughter   . Healthy Daughter   . Anxiety disorder Daughter   . Healthy Daughter   . Breast cancer Neg Hx  Past Surgical History:  Procedure Laterality Date  . AUGMENTATION MAMMAPLASTY  2004   in [place], right breast was larger  . BREAST BIOPSY Left 11/18/2016   core bx Byrnett, neg  . TUBAL LIGATION  2006   Social History   Social History Narrative    Married 22 years. 2 girls- college and DTR in grad school. 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   Immunization History   Administered Date(s) Administered  . Influenza,inj,Quad PF,6+ Mos 04/18/2018, 03/19/2019  . Moderna SARS-COVID-2 Vaccination 08/02/2019, 08/31/2019  . Tdap 08/14/2017     Objective: Vital Signs: BP 134/89 (BP Location: Left Arm, Patient Position: Sitting, Cuff Size: Normal)   Pulse 66   Resp 15   Ht 5' 7"  (1.702 m)   Wt 185 lb 6.4 oz (84.1 kg)   BMI 29.04 kg/m    Physical Exam Vitals and nursing note reviewed.  Constitutional:      Appearance: She is well-developed.  HENT:     Head: Normocephalic and atraumatic.  Eyes:     Conjunctiva/sclera: Conjunctivae normal.  Cardiovascular:     Rate and Rhythm: Normal rate and regular rhythm.     Heart sounds: Normal heart sounds.  Pulmonary:     Effort: Pulmonary effort is normal.     Breath sounds: Normal breath sounds.  Abdominal:     General: Bowel sounds are normal.     Palpations: Abdomen is soft.  Musculoskeletal:     Cervical back: Normal range of motion.  Lymphadenopathy:     Cervical: No cervical adenopathy.  Skin:    General: Skin is warm and dry.     Capillary Refill: Capillary refill takes less than 2 seconds.  Neurological:     Mental Status: She is alert and oriented to person, place, and time.  Psychiatric:        Behavior: Behavior normal.      Musculoskeletal Exam: C-spine thoracic and lumbar spine with good range of motion.  Shoulder joints, elbow joints, wrist joints with good range of motion.  She has bilateral PIP and DIP thickening with no synovitis.  She has mild tenderness over bilateral trochanteric bursa.  She has crepitus in her knee joints without any warmth swelling or effusion.  She has bilateral pes planus.  CDAI Exam: CDAI Score: -- Patient Global: --; Provider Global: -- Swollen: --; Tender: -- Joint Exam 01/25/2020   No joint exam has been documented for this visit   There is currently no information documented on the homunculus. Go to the Rheumatology activity and complete the  homunculus joint exam.  Investigation: No additional findings.  Imaging: No results found.  Recent Labs: Lab Results  Component Value Date   WBC 6.9 09/13/2019   HGB 13.3 09/13/2019   PLT 282.0 09/13/2019   NA 137 09/13/2019   K 3.7 09/13/2019   CL 104 09/13/2019   CO2 26 09/13/2019   GLUCOSE 93 09/13/2019   BUN 19 09/13/2019   CREATININE 0.80 09/13/2019   BILITOT 0.5 08/09/2019   ALKPHOS 62 08/09/2019   AST 22 08/09/2019   ALT 19 08/09/2019   PROT 7.5 08/09/2019   ALBUMIN 4.1 08/09/2019   CALCIUM 8.9 09/13/2019   GFRAA 107 10/19/2018   QFTBGOLDPLUS Negative 10/19/2018   12/12/19 CK95,ESR11, RF<14, anti-CCP <16, 14-3-3n neg, ANA neg, HLAB-27 neg Speciality Comments: No specialty comments available.  Procedures:  No procedures performed Allergies: Levofloxacin   Assessment / Plan:     Visit Diagnoses: Primary osteoarthritis  of both hands-detailed counseling guarding osteoarthritis was provided.  Joint protection muscle strengthening was discussed.  Exercises were demonstrated in the office and a handout was given.  Trochanteric bursitis of both hips-she has been doing some stretching exercises for trochanteric bursa.  Primary osteoarthritis of both knees - moderate OA and moderate chondromalacia.  Detailed counseling guarding osteoarthritis was provided.  Low impact exercise and muscle strengthening was discussed.  A handout on exercises was given.  Weight loss was also discussed.  We discussed the option of cortisone injection and may be impossible [place] supplement injections in the future if she has persistent symptoms.  I have given her a handout on natural anti-inflammatories.  Primary osteoarthritis of both feet-use of proper fitting shoes was discussed.  Pes planus of both feet-arch support and muscle strengthening exercises were discussed.  DDD (degenerative disc disease), lumbar-exercises were demonstrated in the office and a handout was given.  Generalized  body aches-all autoimmune work-up and CK was normal.  Elevated sed rate-repeat sed rate is normal.  Essential hypertension-her blood pressure was mildly elevated today.  Anxiety and depression  Environmental and seasonal allergies  Family history of myocardial infarction in first degree female relative  Educated about COVID-19 virus infection-she is fully immunized against COVID-19.  Have advised her to get booster when due.  Use of mask, social distancing and hand hygiene was discussed.  We also discussed that she may qualify for monoclonal antibody infusion if she gets COVID-19 infection.  Orders: No orders of the defined types were placed in this encounter.  No orders of the defined types were placed in this encounter.     Follow-Up Instructions: Return if symptoms worsen or fail to improve, for Osteoarthritis.   Bo Merino, MD  Note - This record has been created using Editor, commissioning.  Chart creation errors have been sought, but may not always  have been located. Such creation errors do not reflect on  the standard of medical care.

## 2020-01-25 ENCOUNTER — Other Ambulatory Visit: Payer: Self-pay

## 2020-01-25 ENCOUNTER — Encounter: Payer: Self-pay | Admitting: Rheumatology

## 2020-01-25 ENCOUNTER — Ambulatory Visit: Payer: BC Managed Care – PPO | Admitting: Rheumatology

## 2020-01-25 VITALS — BP 134/89 | HR 66 | Resp 15 | Ht 67.0 in | Wt 185.4 lb

## 2020-01-25 DIAGNOSIS — M19042 Primary osteoarthritis, left hand: Secondary | ICD-10-CM

## 2020-01-25 DIAGNOSIS — M2142 Flat foot [pes planus] (acquired), left foot: Secondary | ICD-10-CM

## 2020-01-25 DIAGNOSIS — J3089 Other allergic rhinitis: Secondary | ICD-10-CM

## 2020-01-25 DIAGNOSIS — R700 Elevated erythrocyte sedimentation rate: Secondary | ICD-10-CM

## 2020-01-25 DIAGNOSIS — M2141 Flat foot [pes planus] (acquired), right foot: Secondary | ICD-10-CM

## 2020-01-25 DIAGNOSIS — M51369 Other intervertebral disc degeneration, lumbar region without mention of lumbar back pain or lower extremity pain: Secondary | ICD-10-CM

## 2020-01-25 DIAGNOSIS — F32A Depression, unspecified: Secondary | ICD-10-CM

## 2020-01-25 DIAGNOSIS — F329 Major depressive disorder, single episode, unspecified: Secondary | ICD-10-CM

## 2020-01-25 DIAGNOSIS — Z7189 Other specified counseling: Secondary | ICD-10-CM

## 2020-01-25 DIAGNOSIS — Z8249 Family history of ischemic heart disease and other diseases of the circulatory system: Secondary | ICD-10-CM

## 2020-01-25 DIAGNOSIS — M170 Bilateral primary osteoarthritis of knee: Secondary | ICD-10-CM

## 2020-01-25 DIAGNOSIS — I10 Essential (primary) hypertension: Secondary | ICD-10-CM

## 2020-01-25 DIAGNOSIS — R52 Pain, unspecified: Secondary | ICD-10-CM

## 2020-01-25 DIAGNOSIS — M19071 Primary osteoarthritis, right ankle and foot: Secondary | ICD-10-CM | POA: Diagnosis not present

## 2020-01-25 DIAGNOSIS — M19041 Primary osteoarthritis, right hand: Secondary | ICD-10-CM

## 2020-01-25 DIAGNOSIS — M19072 Primary osteoarthritis, left ankle and foot: Secondary | ICD-10-CM

## 2020-01-25 DIAGNOSIS — M7061 Trochanteric bursitis, right hip: Secondary | ICD-10-CM

## 2020-01-25 DIAGNOSIS — F419 Anxiety disorder, unspecified: Secondary | ICD-10-CM

## 2020-01-25 DIAGNOSIS — M5136 Other intervertebral disc degeneration, lumbar region: Secondary | ICD-10-CM

## 2020-01-25 DIAGNOSIS — M7062 Trochanteric bursitis, left hip: Secondary | ICD-10-CM

## 2020-01-25 NOTE — Patient Instructions (Signed)
Journal for Nurse Practitioners, 15(4), [phone]. Retrieved February 20, 2018 from [email or web address].[email or web address]">  Knee Exercises Ask your health care provider which exercises are safe for you. Do exercises exactly as told by your health care provider and adjust them as directed. It is normal to feel mild stretching, pulling, tightness, or discomfort as you do these exercises. Stop right away if you feel sudden pain or your pain gets worse. Do not begin these exercises until told by your health care provider. Stretching and range-of-motion exercises These exercises warm up your muscles and joints and improve the movement and flexibility of your knee. These exercises also help to relieve pain and swelling. Knee extension, prone 1. Lie on your abdomen (prone position) on a bed. 2. Place your left / right knee just beyond the edge of the surface so your knee is not on the bed. You can put a towel under your left / right thigh just above your kneecap for comfort. 3. Relax your leg muscles and allow gravity to straighten your knee (extension). You should feel a stretch behind your left / right knee. 4. Hold this position for __________ seconds. 5. Scoot up so your knee is supported between repetitions. Repeat __________ times. Complete this exercise __________ times a day. Knee flexion, active  1. Lie on your back with both legs straight. If this causes back discomfort, bend your left / right knee so your foot is flat on the floor. 2. Slowly slide your left / right heel back toward your buttocks. Stop when you feel a gentle stretch in the front of your knee or thigh (flexion). 3. Hold this position for __________ seconds. 4. Slowly slide your left / right heel back to the starting position. Repeat __________ times. Complete this exercise __________ times a day. Quadriceps stretch, prone  1. Lie on your abdomen on a firm surface, such as a bed or padded floor. 2. Bend your left / right knee and hold  your ankle. If you cannot reach your ankle or pant leg, loop a belt around your foot and grab the belt instead. 3. Gently pull your heel toward your buttocks. Your knee should not slide out to the side. You should feel a stretch in the front of your thigh and knee (quadriceps). 4. Hold this position for __________ seconds. Repeat __________ times. Complete this exercise __________ times a day. Hamstring, supine 1. Lie on your back (supine position). 2. Loop a belt or towel over the ball of your left / right foot. The ball of your foot is on the walking surface, right under your toes. 3. Straighten your left / right knee and slowly pull on the belt to raise your leg until you feel a gentle stretch behind your knee (hamstring). ? Do not let your knee bend while you do this. ? Keep your other leg flat on the floor. 4. Hold this position for __________ seconds. Repeat __________ times. Complete this exercise __________ times a day. Strengthening exercises These exercises build strength and endurance in your knee. Endurance is the ability to use your muscles for a long time, even after they get tired. Quadriceps, isometric This exercise stretches the muscles in front of your thigh (quadriceps) without moving your knee joint (isometric). 1. Lie on your back with your left / right leg extended and your other knee bent. Put a rolled towel or small pillow under your knee if told by your health care provider. 2. Slowly tense the muscles in the front of your left /  right thigh. You should see your kneecap slide up toward your hip or see increased dimpling just above the knee. This motion will push the back of the knee toward the floor. 3. For __________ seconds, hold the muscle as tight as you can without increasing your pain. 4. Relax the muscles slowly and completely. Repeat __________ times. Complete this exercise __________ times a day. Straight leg raises This exercise stretches the muscles in front  of your thigh (quadriceps) and the muscles that move your hips (hip flexors). 1. Lie on your back with your left / right leg extended and your other knee bent. 2. Tense the muscles in the front of your left / right thigh. You should see your kneecap slide up or see increased dimpling just above the knee. Your thigh may even shake a bit. 3. Keep these muscles tight as you raise your leg 4-6 inches (10-15 cm) off the floor. Do not let your knee bend. 4. Hold this position for __________ seconds. 5. Keep these muscles tense as you lower your leg. 6. Relax your muscles slowly and completely after each repetition. Repeat __________ times. Complete this exercise __________ times a day. Hamstring, isometric 1. Lie on your back on a firm surface. 2. Bend your left / right knee about __________ degrees. 3. Dig your left / right heel into the surface as if you are trying to pull it toward your buttocks. Tighten the muscles in the back of your thighs (hamstring) to "dig" as hard as you can without increasing any pain. 4. Hold this position for __________ seconds. 5. Release the tension gradually and allow your muscles to relax completely for __________ seconds after each repetition. Repeat __________ times. Complete this exercise __________ times a day. Hamstring curls If told by your health care provider, do this exercise while wearing ankle weights. Begin with __________ lb weights. Then increase the weight by 1 lb (0.5 kg) increments. Do not wear ankle weights that are more than __________ lb. 1. Lie on your abdomen with your legs straight. 2. Bend your left / right knee as far as you can without feeling pain. Keep your hips flat against the floor. 3. Hold this position for __________ seconds. 4. Slowly lower your leg to the starting position. Repeat __________ times. Complete this exercise __________ times a day. Squats This exercise strengthens the muscles in front of your thigh and knee  (quadriceps). 1. Stand in front of a table, with your feet and knees pointing straight ahead. You may rest your hands on the table for balance but not for support. 2. Slowly bend your knees and lower your hips like you are going to sit in a chair. ? Keep your weight over your heels, not over your toes. ? Keep your lower legs upright so they are parallel with the table legs. ? Do not let your hips go lower than your knees. ? Do not bend lower than told by your health care provider. ? If your knee pain increases, do not bend as low. 3. Hold the squat position for __________ seconds. 4. Slowly push with your legs to return to standing. Do not use your hands to pull yourself to standing. Repeat __________ times. Complete this exercise __________ times a day. Wall slides This exercise strengthens the muscles in front of your thigh and knee (quadriceps). 1. Lean your back against a smooth wall or door, and walk your feet out 18-24 inches (46-61 cm) from it. 2. Place your feet hip-width apart. 3.  Slowly slide down the wall or door until your knees bend __________ degrees. Keep your knees over your heels, not over your toes. Keep your knees in line with your hips. 4. Hold this position for __________ seconds. Repeat __________ times. Complete this exercise __________ times a day. Straight leg raises This exercise strengthens the muscles that rotate the leg at the hip and move it away from your body (hip abductors). 1. Lie on your side with your left / right leg in the top position. Lie so your head, shoulder, knee, and hip line up. You may bend your bottom knee to help you keep your balance. 2. Roll your hips slightly forward so your hips are stacked directly over each other and your left / right knee is facing forward. 3. Leading with your heel, lift your top leg 4-6 inches (10-15 cm). You should feel the muscles in your outer hip lifting. ? Do not let your foot drift forward. ? Do not let your knee  roll toward the ceiling. 4. Hold this position for __________ seconds. 5. Slowly return your leg to the starting position. 6. Let your muscles relax completely after each repetition. Repeat __________ times. Complete this exercise __________ times a day. Straight leg raises This exercise stretches the muscles that move your hips away from the front of the pelvis (hip extensors). 1. Lie on your abdomen on a firm surface. You can put a pillow under your hips if that is more comfortable. 2. Tense the muscles in your buttocks and lift your left / right leg about 4-6 inches (10-15 cm). Keep your knee straight as you lift your leg. 3. Hold this position for __________ seconds. 4. Slowly lower your leg to the starting position. 5. Let your leg relax completely after each repetition. Repeat __________ times. Complete this exercise __________ times a day. This information is not intended to replace advice given to you by your health care provider. Make sure you discuss any questions you have with your health care provider. Document Revised: 02/21/2018 Document Reviewed: 02/21/2018 Elsevier Patient Education  [place]. Hand Exercises Hand exercises can be helpful for almost anyone. These exercises can strengthen the hands, improve flexibility and movement, and increase blood flow to the hands. These results can make work and daily tasks easier. Hand exercises can be especially helpful for people who have joint pain from arthritis or have nerve damage from overuse (carpal tunnel syndrome). These exercises can also help people who have injured a hand. Exercises Most of these hand exercises are gentle stretching and motion exercises. It is usually safe to do them often throughout the day. Warming up your hands before exercise may help to reduce stiffness. You can do this with gentle massage or by placing your hands in warm water for 10-15 minutes. It is normal to feel some stretching, pulling,  tightness, or mild discomfort as you begin new exercises. This will gradually improve. Stop an exercise right away if you feel sudden, severe pain or your pain gets worse. Ask your health care provider which exercises are best for you. Knuckle bend or "claw" fist 1. Stand or sit with your arm, hand, and all five fingers pointed straight up. Make sure to keep your wrist straight during the exercise. 2. Gently bend your fingers down toward your palm until the tips of your fingers are touching the top of your palm. Keep your big knuckle straight and just bend the small knuckles in your fingers. 3. Hold this position for  __________ seconds. 4. Straighten (extend) your fingers back to the starting position. Repeat this exercise 5-10 times with each hand. Full finger fist 1. Stand or sit with your arm, hand, and all five fingers pointed straight up. Make sure to keep your wrist straight during the exercise. 2. Gently bend your fingers into your palm until the tips of your fingers are touching the middle of your palm. 3. Hold this position for __________ seconds. 4. Extend your fingers back to the starting position, stretching every joint fully. Repeat this exercise 5-10 times with each hand. Straight fist 1. Stand or sit with your arm, hand, and all five fingers pointed straight up. Make sure to keep your wrist straight during the exercise. 2. Gently bend your fingers at the big knuckle, where your fingers meet your hand, and the middle knuckle. Keep the knuckle at the tips of your fingers straight and try to touch the bottom of your palm. 3. Hold this position for __________ seconds. 4. Extend your fingers back to the starting position, stretching every joint fully. Repeat this exercise 5-10 times with each hand. Tabletop 1. Stand or sit with your arm, hand, and all five fingers pointed straight up. Make sure to keep your wrist straight during the exercise. 2. Gently bend your fingers at the big  knuckle, where your fingers meet your hand, as far down as you can while keeping the small knuckles in your fingers straight. Think of forming a tabletop with your fingers. 3. Hold this position for __________ seconds. 4. Extend your fingers back to the starting position, stretching every joint fully. Repeat this exercise 5-10 times with each hand. Finger spread 1. Place your hand flat on a table with your palm facing down. Make sure your wrist stays straight as you do this exercise. 2. Spread your fingers and thumb apart from each other as far as you can until you feel a gentle stretch. Hold this position for __________ seconds. 3. Bring your fingers and thumb tight together again. Hold this position for __________ seconds. Repeat this exercise 5-10 times with each hand. Making circles 1. Stand or sit with your arm, hand, and all five fingers pointed straight up. Make sure to keep your wrist straight during the exercise. 2. Make a circle by touching the tip of your thumb to the tip of your index finger. 3. Hold for __________ seconds. Then open your hand wide. 4. Repeat this motion with your thumb and each finger on your hand. Repeat this exercise 5-10 times with each hand. Thumb motion 1. Sit with your forearm resting on a table and your wrist straight. Your thumb should be facing up toward the ceiling. Keep your fingers relaxed as you move your thumb. 2. Lift your thumb up as high as you can toward the ceiling. Hold for __________ seconds. 3. Bend your thumb across your palm as far as you can, reaching the tip of your thumb for the small finger (pinkie) side of your palm. Hold for __________ seconds. Repeat this exercise 5-10 times with each hand. Grip strengthening  1. Hold a stress ball or other soft ball in the middle of your hand. 2. Slowly increase the pressure, squeezing the ball as much as you can without causing pain. Think of bringing the tips of your fingers into the middle of  your palm. All of your finger joints should bend when doing this exercise. 3. Hold your squeeze for __________ seconds, then relax. Repeat this exercise 5-10 times with each  hand. Contact a health care provider if:  Your hand pain or discomfort gets much worse when you do an exercise.  Your hand pain or discomfort does not improve within 2 hours after you exercise. If you have any of these problems, stop doing these exercises right away. Do not do them again unless your health care provider says that you can. Get help right away if:  You develop sudden, severe hand pain or swelling. If this happens, stop doing these exercises right away. Do not do them again unless your health care provider says that you can. This information is not intended to replace advice given to you by your health care provider. Make sure you discuss any questions you have with your health care provider. Document Revised: 08/24/2018 Document Reviewed: 05/04/2018 Elsevier Patient Education  2020 [place]. Back Exercises The following exercises strengthen the muscles that help to support the trunk and back. They also help to keep the lower back flexible. Doing these exercises can help to prevent back pain or lessen existing pain.  If you have back pain or discomfort, try doing these exercises 2-3 times each day or as told by your health care provider.  As your pain improves, do them once each day, but increase the number of times that you repeat the steps for each exercise (do more repetitions).  To prevent the recurrence of back pain, continue to do these exercises once each day or as told by your health care provider. Do exercises exactly as told by your health care provider and adjust them as directed. It is normal to feel mild stretching, pulling, tightness, or discomfort as you do these exercises, but you should stop right away if you feel sudden pain or your pain gets worse. Exercises Single knee to  chest Repeat these steps 3-5 times for each leg: 1. Lie on your back on a firm bed or the floor with your legs extended. 2. Bring one knee to your chest. Your other leg should stay extended and in contact with the floor. 3. Hold your knee in place by grabbing your knee or thigh with both hands and hold. 4. Pull on your knee until you feel a gentle stretch in your lower back or buttocks. 5. Hold the stretch for 10-30 seconds. 6. Slowly release and straighten your leg. Pelvic tilt Repeat these steps 5-10 times: 1. Lie on your back on a firm bed or the floor with your legs extended. 2. Bend your knees so they are pointing toward the ceiling and your feet are flat on the floor. 3. Tighten your lower abdominal muscles to press your lower back against the floor. This motion will tilt your pelvis so your tailbone points up toward the ceiling instead of pointing to your feet or the floor. 4. With gentle tension and even breathing, hold this position for 5-10 seconds. Cat-cow Repeat these steps until your lower back becomes more flexible: 1. Get into a hands-and-knees position on a firm surface. Keep your hands under your shoulders, and keep your knees under your hips. You may place padding under your knees for comfort. 2. Let your head hang down toward your chest. Contract your abdominal muscles and point your tailbone toward the floor so your lower back becomes rounded like the back of a cat. 3. Hold this position for 5 seconds. 4. Slowly lift your head, let your abdominal muscles relax and point your tailbone up toward the ceiling so your back forms a sagging arch like the  back of a cow. 5. Hold this position for 5 seconds.  Press-ups Repeat these steps 5-10 times: 1. Lie on your abdomen (face-down) on the floor. 2. Place your palms near your head, about shoulder-width apart. 3. Keeping your back as relaxed as possible and keeping your hips on the floor, slowly straighten your arms to raise the  top half of your body and lift your shoulders. Do not use your back muscles to raise your upper torso. You may adjust the placement of your hands to make yourself more comfortable. 4. Hold this position for 5 seconds while you keep your back relaxed. 5. Slowly return to lying flat on the floor.  Bridges Repeat these steps 10 times: 1. Lie on your back on a firm surface. 2. Bend your knees so they are pointing toward the ceiling and your feet are flat on the floor. Your arms should be flat at your sides, next to your body. 3. Tighten your buttocks muscles and lift your buttocks off the floor until your waist is at almost the same height as your knees. You should feel the muscles working in your buttocks and the back of your thighs. If you do not feel these muscles, slide your feet 1-2 inches farther away from your buttocks. 4. Hold this position for 3-5 seconds. 5. Slowly lower your hips to the starting position, and allow your buttocks muscles to relax completely. If this exercise is too easy, try doing it with your arms crossed over your chest. Abdominal crunches Repeat these steps 5-10 times: 1. Lie on your back on a firm bed or the floor with your legs extended. 2. Bend your knees so they are pointing toward the ceiling and your feet are flat on the floor. 3. Cross your arms over your chest. 4. Tip your chin slightly toward your chest without bending your neck. 5. Tighten your abdominal muscles and slowly raise your trunk (torso) high enough to lift your shoulder blades a tiny bit off the floor. Avoid raising your torso higher than that because it can put too much stress on your low back and does not help to strengthen your abdominal muscles. 6. Slowly return to your starting position. Back lifts Repeat these steps 5-10 times: 1. Lie on your abdomen (face-down) with your arms at your sides, and rest your forehead on the floor. 2. Tighten the muscles in your legs and your  buttocks. 3. Slowly lift your chest off the floor while you keep your hips pressed to the floor. Keep the back of your head in line with the curve in your back. Your eyes should be looking at the floor. 4. Hold this position for 3-5 seconds. 5. Slowly return to your starting position. Contact a health care provider if:  Your back pain or discomfort gets much worse when you do an exercise.  Your worsening back pain or discomfort does not lessen within 2 hours after you exercise. If you have any of these problems, stop doing these exercises right away. Do not do them again unless your health care provider says that you can. Get help right away if:  You develop sudden, severe back pain. If this happens, stop doing the exercises right away. Do not do them again unless your health care provider says that you can. This information is not intended to replace advice given to you by your health care provider. Make sure you discuss any questions you have with your health care provider. Document Revised: 09/07/2018 Document Reviewed: 02/02/2018  Elsevier Patient Education  [employer].

## 2020-01-26 ENCOUNTER — Telehealth: Payer: Self-pay | Admitting: Nurse Practitioner

## 2020-01-26 NOTE — Telephone Encounter (Signed)
Please call her - needs to schedule her own  3D mammogram as discussed. Needs OV and I will order BMet in Oct/Nov.   [facility]  [address] [place], [place] [phone]

## 2020-02-04 ENCOUNTER — Ambulatory Visit: Payer: BC Managed Care – PPO | Admitting: Nurse Practitioner

## 2020-02-07 ENCOUNTER — Ambulatory Visit: Payer: BC Managed Care – PPO | Admitting: Nurse Practitioner

## 2020-02-07 ENCOUNTER — Ambulatory Visit: Payer: BC Managed Care – PPO | Admitting: Rheumatology

## 2020-02-12 ENCOUNTER — Other Ambulatory Visit: Payer: Self-pay | Admitting: Nurse Practitioner

## 2020-05-21 ENCOUNTER — Telehealth (INDEPENDENT_AMBULATORY_CARE_PROVIDER_SITE_OTHER): Payer: PRIVATE HEALTH INSURANCE | Admitting: Physician Assistant

## 2020-05-21 DIAGNOSIS — F32A Depression, unspecified: Secondary | ICD-10-CM

## 2020-05-21 DIAGNOSIS — F419 Anxiety disorder, unspecified: Secondary | ICD-10-CM | POA: Diagnosis not present

## 2020-05-21 DIAGNOSIS — I10 Essential (primary) hypertension: Secondary | ICD-10-CM | POA: Diagnosis not present

## 2020-05-21 DIAGNOSIS — U071 COVID-19: Secondary | ICD-10-CM

## 2020-05-21 DIAGNOSIS — M7989 Other specified soft tissue disorders: Secondary | ICD-10-CM | POA: Diagnosis not present

## 2020-05-21 MED ORDER — HYDROCHLOROTHIAZIDE 12.5 MG PO CAPS: 12.5000 mg | ORAL_CAPSULE | Freq: Every day | ORAL | 1 refills | Status: DC

## 2020-05-21 NOTE — Progress Notes (Signed)
New patient visit   Patient: Lori Dawson   DOB: 05/03/1976   45 y.o. Female  MRN: [id number] Visit Date: 05/21/2020  Today's healthcare provider: Trinna Post, PA-C   Chief Complaint  Patient presents with  . New Patient (Initial Visit)  . Covid Positive   Subjective    Lori Dawson is a 45 y.o. female who presents today as a new patient to establish care.   She has two daughters, aged 72 and 49. Oldest in masters for OT and younger at [employer] doing neuroscience. Husband of 25 years. She works as an Optometrist.   Patient currently has COVID 19. Tested positive on 05/17/2020. She has been vaccinated. Oldest daughter tested positive yesterday.   HTN: Previously on HCTZ 12.5 mg daily for HTN. Reports her home blood pressures are higher. In the interim she has discontinued this medication but her BP has increased and she has noticed some leg edema. She would like to restart it.   URI  This is a new problem. The current episode started in the past 7 days. The problem has been unchanged. Maximum temperature: 99.5. Associated symptoms include congestion, coughing, ear pain, headaches, rhinorrhea, sinus pain, sneezing and a sore throat. Pertinent negatives include no diarrhea or wheezing. She has tried antihistamine, NSAIDs and increased fluids for the symptoms. The treatment provided mild relief.     Past Medical History:  Diagnosis Date  . Abnormal mammogram 11/10/2016  . Allergy    seasonal  . Atypical chest pain 09/22/2017  . 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 Environmental and seasonal allergies 09/01/2019  . Herniated lumbar intervertebral disc   . HTN (hypertension) 08/09/2019  . Low back pain 08/09/2019  . Mass of lower inner quadrant of left breast 11/17/2016  . Migraine headache   . Overweight (BMI 25.0-29.9) 08/09/2019  . Palpitations 09/22/2017  . Screening for colon cancer 10/2019   neg Cologuard; repeat due in 3 yrs   Past Surgical History:  Procedure  Laterality Date  . AUGMENTATION MAMMAPLASTY  2004   in [place], right breast was larger  . BREAST BIOPSY Left 11/18/2016   core bx [place]nett, neg  . TUBAL LIGATION  2006   Family Status  Relation Name Status  . [employer]  . PGM  Deceased  . PGF  Deceased  . Mother  Alive  . Father  Alive  . Brother  Deceased  . MGF  Deceased  . MGM  Deceased  . Daughter  Alive       collagen disorder?   . Daughter  Alive       collagen disorder?   . Neg Hx  (Not Specified)   Family History  Problem Relation Age of Onset  . Cervical cancer Maternal Aunt        30s/40s  . Diabetes Paternal Grandmother   . Lung cancer Paternal Grandmother   . Lung cancer Paternal Grandfather   . Thyroid disease Mother   . Arthritis Mother   . Hypertension Father   . Hyperlipidemia Father   . Arthritis Father   . Heart attack Brother 56  . Coronary artery disease Brother   . COPD Brother   . Coronary artery disease Maternal Grandfather   . Anxiety disorder Daughter   . Healthy Daughter   . Anxiety disorder Daughter   . Healthy Daughter   . Breast cancer Neg Hx    Social History   Socioeconomic History  . Marital  status: Married    Spouse name: Not on file  . Number of children: Not on file  . Years of education: Not on file  . Highest education level: Not on file  Occupational History  . Not on file  Tobacco Use  . Smoking status: Never Smoker  . Smokeless tobacco: Never Used  Vaping Use  . Vaping Use: Never used  Substance and Sexual Activity  . Alcohol use: Yes    Alcohol/week: 3.0 standard drinks    Types: 3 Glasses of wine per week  . Drug use: Never  . Sexual activity: Yes    Birth control/protection: Surgical, Pill    Comment: Tubal ligation   Other Topics Concern  . Not on file  Social History Narrative    Married 22 years. 2 girls- college and DTR in grad school. 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   Social Determinants of Health   Financial Resource  Strain: Not on file  Food Insecurity: Not on file  Transportation Needs: Not on file  Physical Activity: Not on file  Stress: Not on file  Social Connections: Not on file   Outpatient Medications Prior to Visit  Medication Sig  . cetirizine (ZYRTEC) 10 MG tablet Take 10 mg by mouth as needed.   . Drospirenone (SLYND) 4 MG TABS Take 1 tablet by mouth daily.  . Olopatadine HCl 0.2 % SOLN as needed.   . ondansetron (ZOFRAN ODT) 4 MG disintegrating tablet Take 1 tablet (4 mg total) by mouth every 6 (six) hours as needed for nausea.  . sertraline (ZOLOFT) 50 MG tablet Take 1-2 tabs daily  . [DISCONTINUED] amoxicillin (AMOXIL) 500 MG capsule Take 500 mg by mouth 3 (three) times daily.   No facility-administered medications prior to visit.   Allergies  Allergen Reactions  . Levofloxacin Nausea And Vomiting    Other reaction(s): Dizziness    Immunization History  Administered Date(s) Administered  . Influenza,inj,Quad PF,6+ Mos 04/18/2018, 03/19/2019  . Moderna Sars-Covid-2 Vaccination 08/02/2019, 08/31/2019  . Tdap 08/14/2017    Health Maintenance  Topic Date Due  . Hepatitis C Screening  Never done  . INFLUENZA VACCINE  08/14/2020 (Originally 12/16/2019)  . PAP SMEAR-Modifier  08/16/2022  . TETANUS/TDAP  08/15/2027  . COVID-19 Vaccine  Completed  . HIV Screening  Completed    Patient Care Team: Paulene Floor as PCP - General (Physician Assistant) Lorelei Pont, Ginette Otto as Referring Physician (Obstetrics and Gynecology) Robert Bellow, MD (General Surgery)  Review of Systems  Constitutional: Positive for appetite change, fatigue and fever (99.5). Negative for chills.  HENT: Positive for congestion, ear pain, postnasal drip, rhinorrhea, sinus pressure, sinus pain, sneezing and sore throat.   Respiratory: Positive for cough. Negative for chest tightness, shortness of breath and wheezing.   Gastrointestinal: Negative for diarrhea.  Neurological: Positive for  weakness and headaches.      Objective    There were no vitals taken for this visit. Physical Exam Constitutional:      Appearance: Normal appearance. She is ill-appearing.  Pulmonary:     Effort: Pulmonary effort is normal. No respiratory distress.  Neurological:     Mental Status: She is alert.  Psychiatric:        Mood and Affect: Mood normal.        Behavior: Behavior normal.      Depression Screen PHQ 2/9 Scores 08/09/2019 08/09/2019  PHQ - 2 Score 2 0  PHQ- 9 Score 10 -  No results found for any visits on 05/21/20.  Assessment & Plan     1. Anxiety and depression  She continues zoloft from [facility] provider.   2. Primary hypertension  Restart HCTZ for BP and edema. Recommend her coming up for follow up physical when she feels better.   - hydrochlorothiazide (MICROZIDE) 12.5 MG capsule; Take 1 capsule (12.5 mg total) by mouth daily.  Dispense: 90 capsule; Refill: 1  3. Leg swelling   4. COVID-19  Recommended infusion treatment, advised of multiple location throughout the state.    Return in about 3 months (around 08/19/2020) for CPE.     ITrinna Post, PA-C, have reviewed all documentation for this visit. The documentation on 05/28/20 for the exam, diagnosis, procedures, and orders are all accurate and complete.  The entirety of the information documented in the History of Present Illness, Review of Systems and Physical Exam were personally obtained by me. Portions of this information were initially documented by [facility] and reviewed by me for thoroughness and accuracy.     Paulene Floor  [facility] [phone] (phone) [phone] (fax)  [place]

## 2020-05-27 ENCOUNTER — Encounter: Payer: Self-pay | Admitting: Physician Assistant

## 2020-05-28 NOTE — Telephone Encounter (Signed)
Called patient and scheduled appointment for 06/03/2020.

## 2020-05-28 NOTE — Telephone Encounter (Signed)
Can we call patient and schedule follow up? Thank you!

## 2020-06-03 ENCOUNTER — Ambulatory Visit: Payer: Self-pay | Admitting: Physician Assistant

## 2020-08-07 ENCOUNTER — Ambulatory Visit: Payer: Self-pay | Admitting: Physician Assistant

## 2020-08-08 ENCOUNTER — Encounter: Payer: Self-pay | Admitting: Family Medicine

## 2020-08-08 ENCOUNTER — Other Ambulatory Visit: Payer: Self-pay

## 2020-08-08 ENCOUNTER — Ambulatory Visit: Payer: PRIVATE HEALTH INSURANCE | Admitting: Family Medicine

## 2020-08-08 VITALS — BP 126/83 | HR 71 | Temp 97.7°F | Resp 18 | Wt 196.0 lb

## 2020-08-08 DIAGNOSIS — F32A Depression, unspecified: Secondary | ICD-10-CM | POA: Diagnosis not present

## 2020-08-08 DIAGNOSIS — F419 Anxiety disorder, unspecified: Secondary | ICD-10-CM

## 2020-08-08 DIAGNOSIS — I10 Essential (primary) hypertension: Secondary | ICD-10-CM

## 2020-08-08 MED ORDER — BUPROPION HCL ER (SR) 150 MG PO TB12: ORAL_TABLET | ORAL | 2 refills | Status: DC

## 2020-08-08 NOTE — Progress Notes (Signed)
Established patient visit   Patient: Lori Dawson   DOB: September 02, 1975   45 y.o. Female  MRN: [id number] Visit Date: 08/08/2020  Today's healthcare provider: Lelon Huh, MD   Chief Complaint  Patient presents with  . Hypertension   Subjective    HPI  Hypertension, follow-up  BP Readings from Last 3 Encounters:  08/08/20 126/83  01/25/20 134/89  12/12/19 (!) 130/83   Wt Readings from Last 3 Encounters:  08/08/20 196 lb (88.9 kg)  01/25/20 185 lb 6.4 oz (84.1 kg)  12/12/19 194 lb (88 kg)     She was last seen for hypertension 2 months ago (seen by Carles Collet, PA-C).   Management since that visit includes restarting HCTZ for BP and edema. She has been on hctz by previous MD in the past, but was apparently discontinued when she started having low sodium.   She reports good compliance with treatment. She is not having side effects.  She is following a Regular diet. She is not execising. She does not smoke.  Use of agents associated with hypertension: none.   Outside blood pressures are 147/80. Symptoms: No chest pain No chest pressure  No palpitations No syncope  No dyspnea No orthopnea  No paroxysmal nocturnal dyspnea Yes lower extremity edema   Pertinent labs: Lab Results  Component Value Date   CHOL 171 08/09/2019   HDL 42.50 08/09/2019   LDLCALC 105 (H) 08/09/2019   TRIG 116.0 08/09/2019   CHOLHDL 4 08/09/2019   Lab Results  Component Value Date   NA 137 09/13/2019   K 3.7 09/13/2019   CREATININE 0.80 09/13/2019   GFRNONAA 93 10/19/2018   GFRAA 107 10/19/2018   GLUCOSE 93 09/13/2019     The 10-year ASCVD risk score Mikey Bussing DC Jr., et al., 2013) is: 1.3%   ---------------------------------------------------------------------------------------------------  Follow up Anxiety.  She started with anxiety after her brother unexpectedly passed away from MI last year causing her to feel very anxious about similar fate to other family members or  herself. She had been doing very well with sertraline, but anxiety has pretty much resolve and she has been weaning down, no usually only taking 1/2 tablet a day. However she does express concern about being depressed and is interested in trying a different medication just for depression, but is concerned medication contributing to weight gain.   PHQ9 SCORE ONLY 08/08/2020 08/09/2019 08/09/2019  PHQ-9 Total Score 10 10 0         Medications: Outpatient Medications Prior to Visit  Medication Sig  . cetirizine (ZYRTEC) 10 MG tablet Take 10 mg by mouth as needed.   . Drospirenone (SLYND) 4 MG TABS Take 1 tablet by mouth daily.  . hydrochlorothiazide (MICROZIDE) 12.5 MG capsule Take 1 capsule (12.5 mg total) by mouth daily.  . Olopatadine HCl 0.2 % SOLN as needed.   . ondansetron (ZOFRAN ODT) 4 MG disintegrating tablet Take 1 tablet (4 mg total) by mouth every 6 (six) hours as needed for nausea.  . sertraline (ZOLOFT) 50 MG tablet Take 1-2 tabs daily   No facility-administered medications prior to visit.    Review of Systems  Constitutional: Negative for appetite change, chills, fatigue and fever.  Respiratory: Negative for chest tightness and shortness of breath.   Cardiovascular: Negative for chest pain and palpitations. Leg swelling: in ankles.  Gastrointestinal: Negative for abdominal pain, nausea and vomiting.  Musculoskeletal: Positive for joint swelling (in hands).  Neurological: Negative for dizziness and weakness.  Objective    BP 126/83 (BP Location: Left Arm, Patient Position: Sitting, Cuff Size: Large)   Pulse 71   Temp 97.7 F (36.5 C) (Temporal)   Resp 18   Wt 196 lb (88.9 kg)   BMI 30.70 kg/m     Physical Exam   General appearance: Overweight female, cooperative and in no acute distress Psych: Appropriate mood and affect. Neurologic: Mental status: Alert, oriented to person, place, and time, thought content appropriate.   PHQ-9 = 10  Assessment & Plan      1. Primary hypertension Well controlled since restarting low dose of hctz - CBC - Comprehensive metabolic panel - TSH - Magnesium - Lipid panel  2. Anxiety and depression It's been a year since her brother passed away and anxiety has resolved, but she is having some depression. Will add- buPROPion (WELLBUTRIN SR) 150 MG 12 hr tablet; One tablet daily for 7 days then increase to twice a day  Dispense: 60 tablet; Refill: 2   Continue low dose sertraline for now, but may be able to discontinue if anxiety remains in remission and if bupropion significantly helps depression.        The entirety of the information documented in the History of Present Illness, Review of Systems and Physical Exam were personally obtained by me. Portions of this information were initially documented by the CMA and reviewed by me for thoroughness and accuracy.      Lelon Huh, MD  [facility] [phone] (phone) [phone] (fax)  [place]

## 2020-08-08 NOTE — Patient Instructions (Addendum)

## 2020-08-09 LAB — COMPREHENSIVE METABOLIC PANEL
ALT: 66 IU/L — ABNORMAL HIGH (ref 0–32)
AST: 44 IU/L — ABNORMAL HIGH (ref 0–40)
Albumin/Globulin Ratio: 1.5 (ref 1.2–2.2)
Albumin: 4.6 g/dL (ref 3.8–4.8)
Alkaline Phosphatase: 85 IU/L (ref 44–121)
BUN/Creatinine Ratio: 19 (ref 9–23)
BUN: 19 mg/dL (ref 6–24)
Bilirubin Total: 0.4 mg/dL (ref 0.0–1.2)
CO2: 21 mmol/L (ref 20–29)
Calcium: 10 mg/dL (ref 8.7–10.2)
Chloride: 102 mmol/L (ref 96–106)
Creatinine, Ser: 0.98 mg/dL (ref 0.57–1.00)
Globulin, Total: 3.1 g/dL (ref 1.5–4.5)
Glucose: 88 mg/dL (ref 65–99)
Potassium: 4.4 mmol/L (ref 3.5–5.2)
Sodium: 139 mmol/L (ref 134–144)
Total Protein: 7.7 g/dL (ref 6.0–8.5)
eGFR: 72 mL/min/{1.73_m2} (ref >59)

## 2020-08-09 LAB — CBC
Hematocrit: 41.3 % (ref 34.0–46.6)
Hemoglobin: 13.7 g/dL (ref 11.1–15.9)
MCH: 31.1 pg (ref 26.6–33.0)
MCHC: 33.2 g/dL (ref 31.5–35.7)
MCV: 94 fL (ref 79–97)
Platelets: 291 10*3/uL (ref 150–450)
RBC: 4.4 x10E6/uL (ref 3.77–5.28)
RDW: 12.7 % (ref 11.7–15.4)
WBC: 8.3 10*3/uL (ref 3.4–10.8)

## 2020-08-09 LAB — LIPID PANEL
Chol/HDL Ratio: 4.4 ratio (ref 0.0–4.4)
Cholesterol, Total: 190 mg/dL (ref 100–199)
HDL: 43 mg/dL (ref >39)
LDL Chol Calc (NIH): 131 mg/dL — ABNORMAL HIGH (ref 0–99)
Triglycerides: 86 mg/dL (ref 0–149)
VLDL Cholesterol Cal: 16 mg/dL (ref 5–40)

## 2020-08-09 LAB — TSH: TSH: 1.05 u[IU]/mL (ref 0.450–4.500)

## 2020-08-09 LAB — MAGNESIUM: Magnesium: 2.1 mg/dL (ref 1.6–2.3)

## 2020-10-01 NOTE — Progress Notes (Signed)
Established patient visit   Patient: Lori Dawson   DOB: July 11, 1975   45 y.o. Female  MRN: [id number] Visit Date: 10/03/2020  Today's healthcare provider: Lelon Huh, MD   Chief Complaint  Patient presents with  . Anxiety  . Depression   Subjective    HPI  Anxiety and Depression, Follow-up  She was last seen for anxiety on 08/08/2020.   Changes made at last visit include adding- buPROPion (WELLBUTRIN SR) 150 MG 12 hr tablet; One tablet daily for 7 days then increase to twice a day. Since then she has also weaned off sertraline which she thought may be contributing to difficulty losing weight. It was originally prescribed for anxiety and panic attacks which have completely resolved.    She reports good compliance with treatment. She reports good tolerance of treatment. She is not having side effects.   She feels her anxiety is moderate and Improved since last visit, although she still has crying spells a few days a week.   Symptoms: No chest pain Yes difficulty concentrating  No dizziness Yes fatigue  No feelings of losing control Yes insomnia  No irritable No palpitations  No panic attacks No racing thoughts  No shortness of breath No sweating  No tremors/shakes    GAD-7 Results GAD-7 Generalized Anxiety Disorder Screening Tool 08/09/2019  1. Feeling Nervous, Anxious, or on Edge 1  2. Not Being Able to Stop or Control Worrying 1  3. Worrying Too Much About Different Things 1  4. Trouble Relaxing 2  5. Being So Restless it's Hard To Sit Still 1  6. Becoming Easily Annoyed or Irritable 1  7. Feeling Afraid As If Something Awful Might Happen 2  Total GAD-7 Score 9    PHQ-9 Scores PHQ9 SCORE ONLY 10/03/2020 08/08/2020 08/09/2019  PHQ-9 Total Score 6 10 10     --------------------------------------------------------------------------------------------------- Follow up for elevated liver functions:  The patient was last seen for this on 08/08/2020.   Labs were  ordered during that visit showing elevated Liver functions. Patient was advised to follow up in 6-8 weeks for recheck.   -----------------------------------------------------------------------------------------       Medications: Outpatient Medications Prior to Visit  Medication Sig  . buPROPion (WELLBUTRIN SR) 150 MG 12 hr tablet One tablet daily for 7 days then increase to twice a day  . cetirizine (ZYRTEC) 10 MG tablet Take 10 mg by mouth as needed.   . Drospirenone (SLYND) 4 MG TABS Take 1 tablet by mouth daily.  . hydrochlorothiazide (MICROZIDE) 12.5 MG capsule Take 1 capsule (12.5 mg total) by mouth daily.  . Olopatadine HCl 0.2 % SOLN as needed.   . ondansetron (ZOFRAN ODT) 4 MG disintegrating tablet Take 1 tablet (4 mg total) by mouth every 6 (six) hours as needed for nausea.  . sertraline (ZOLOFT) 50 MG tablet Take 1-2 tabs daily (Patient not taking: Reported on 10/03/2020)   No facility-administered medications prior to visit.    Review of Systems  Constitutional: Negative for appetite change, chills, fatigue and fever.  Respiratory: Negative for chest tightness and shortness of breath.   Cardiovascular: Negative for chest pain and palpitations.  Gastrointestinal: Negative for abdominal pain, nausea and vomiting.  Neurological: Negative for dizziness and weakness.       Objective    BP 113/77 (BP Location: Left Arm, Patient Position: Sitting, Cuff Size: Normal)   Pulse 73   Temp 97.7 F (36.5 C) (Temporal)   Resp 16   Wt 186 lb (84.4  kg)   BMI 29.13 kg/m     Physical Exam  General appearance:  Well developed, well nourished female, cooperative and in no acute distress Head: Normocephalic, without obvious abnormality, atraumatic Respiratory: Respirations even and unlabored, normal respiratory rate Extremities: All extremities are intact.  Skin: Skin color, texture, turgor normal. No rashes seen  Psych: Appropriate mood and affect. Neurologic: Mental status:  Alert, oriented to person, place, and time, thought content appropriate.     Assessment & Plan     1. Anxiety and depression Anxiety has completely resolved without relapse off of sertraline. Depression is improved but not completely controlled since adding bupropion. Discussed starting back on lower dose of sertraline, but she would rather try maxing out bupropion. Will change to XL formulation and add an additional 112m a day.   Prescriptions - buPROPion (WELLBUTRIN XL) 300 MG 24 hr tablet; Take 1 tablet (300 mg total) by mouth every morning.  Dispense: 30 tablet; Refill: 5 - buPROPion (WELLBUTRIN XL) 150 MG 24 hr tablet; Take 1 tablet (150 mg total) by mouth every evening.  Dispense: 30 tablet; Refill: 5    Follow up in 3 months.   2. Elevated transaminase level  - Comprehensive metabolic panel - Hepatitis B Core Antibody, total - Hepatitis C Antibody       The entirety of the information documented in the History of Present Illness, Review of Systems and Physical Exam were personally obtained by me. Portions of this information were initially documented by the CMA and reviewed by me for thoroughness and accuracy.      DLelon Huh MD  [phone](phone) [phone](fax)  [place]

## 2020-10-03 ENCOUNTER — Other Ambulatory Visit: Payer: Self-pay

## 2020-10-03 ENCOUNTER — Encounter: Payer: Self-pay | Admitting: Family Medicine

## 2020-10-03 ENCOUNTER — Ambulatory Visit: Payer: PRIVATE HEALTH INSURANCE | Admitting: Family Medicine

## 2020-10-03 VITALS — BP 113/77 | HR 73 | Temp 97.7°F | Resp 16 | Wt 186.0 lb

## 2020-10-03 DIAGNOSIS — F32A Depression, unspecified: Secondary | ICD-10-CM

## 2020-10-03 DIAGNOSIS — R7401 Elevation of levels of liver transaminase levels: Secondary | ICD-10-CM

## 2020-10-03 DIAGNOSIS — F419 Anxiety disorder, unspecified: Secondary | ICD-10-CM | POA: Diagnosis not present

## 2020-10-03 MED ORDER — BUPROPION HCL ER (XL) 150 MG PO TB24: 150.0000 mg | ORAL_TABLET | Freq: Every evening | ORAL | 5 refills | Status: DC

## 2020-10-03 MED ORDER — BUPROPION HCL ER (XL) 300 MG PO TB24: 300.0000 mg | ORAL_TABLET | Freq: Every morning | ORAL | 5 refills | Status: DC

## 2020-10-03 NOTE — Patient Instructions (Signed)
.   I strongly recommend at least one booster dose (a third shot) of the Covid vaccine for all adults. People at high risk for serious Covid infections should have a second booster dose 4-6 months after the first booster.

## 2020-10-04 ENCOUNTER — Encounter: Payer: Self-pay | Admitting: Family Medicine

## 2020-10-04 DIAGNOSIS — R768 Other specified abnormal immunological findings in serum: Secondary | ICD-10-CM

## 2020-10-04 LAB — HEPATITIS C ANTIBODY: Hep C Virus Ab: 2.3 s/co ratio — ABNORMAL HIGH (ref 0.0–0.9)

## 2020-10-04 LAB — COMPREHENSIVE METABOLIC PANEL
ALT: 37 IU/L — ABNORMAL HIGH (ref 0–32)
AST: 26 IU/L (ref 0–40)
Albumin/Globulin Ratio: 1.7 (ref 1.2–2.2)
Albumin: 4.7 g/dL (ref 3.8–4.8)
Alkaline Phosphatase: 80 IU/L (ref 44–121)
BUN/Creatinine Ratio: 16 (ref 9–23)
BUN: 14 mg/dL (ref 6–24)
Bilirubin Total: 0.4 mg/dL (ref 0.0–1.2)
CO2: 20 mmol/L (ref 20–29)
Calcium: 9.9 mg/dL (ref 8.7–10.2)
Chloride: 102 mmol/L (ref 96–106)
Creatinine, Ser: 0.85 mg/dL (ref 0.57–1.00)
Globulin, Total: 2.8 g/dL (ref 1.5–4.5)
Glucose: 102 mg/dL — ABNORMAL HIGH (ref 65–99)
Potassium: 4.4 mmol/L (ref 3.5–5.2)
Sodium: 137 mmol/L (ref 134–144)
Total Protein: 7.5 g/dL (ref 6.0–8.5)
eGFR: 86 mL/min/{1.73_m2} (ref >59)

## 2020-10-04 LAB — HEPATITIS B CORE ANTIBODY, TOTAL: Hep B Core Total Ab: NEGATIVE

## 2020-10-09 LAB — HCV RNA QUANT RFLX ULTRA OR GENOTYP: HCV Quant Baseline: NOT DETECTED IU/mL

## 2020-10-31 ENCOUNTER — Telehealth: Payer: Self-pay

## 2020-10-31 DIAGNOSIS — I10 Essential (primary) hypertension: Secondary | ICD-10-CM

## 2020-10-31 MED ORDER — HYDROCHLOROTHIAZIDE 12.5 MG PO CAPS: 12.5000 mg | ORAL_CAPSULE | Freq: Every day | ORAL | 1 refills | Status: DC

## 2020-10-31 NOTE — Telephone Encounter (Signed)
[facility] faxed refill request for the following medications:  hydrochlorothiazide (MICROZIDE) 12.5 MG capsule   Please advise.

## 2020-11-18 ENCOUNTER — Ambulatory Visit: Payer: BC Managed Care – PPO | Admitting: Obstetrics and Gynecology

## 2020-11-27 ENCOUNTER — Other Ambulatory Visit: Payer: Self-pay | Admitting: Obstetrics and Gynecology

## 2020-11-27 DIAGNOSIS — N920 Excessive and frequent menstruation with regular cycle: Secondary | ICD-10-CM

## 2020-12-29 NOTE — Progress Notes (Signed)
Chief Complaint  Patient presents with   Gynecologic Exam    No concerns     HPI:      [name] is a 45 y.o. G2P0 who LMP was Patient's last menstrual period was 12/16/2020 (approximate)., presents today for her annual examination. Her menses are Q3 months with slynd continuous dosing, lasting 5-7 days, 1 heavy day, no BTB. Does have dysmen on heavy day, improved with NSAIDs.  Being treated for menometrorrhagia with POPs, used be on OCPs but changed 5/21 due to HTN dx. Had neg DUB eval 2019 with labs and u/s.   Sex activity: single partner, contraception - tubal ligation.  Last Pap: 08/15/17  Results were: no abnormalities /neg HPV DNA  Hx of STDs: none   Last mammogram: 12/25/18  Results were: normal; repeat in 12 months.  There is no FH of breast cancer. There is no FH of ovarian cancer. The patient does do self-breast exams.   Tobacco use: The patient denies current or previous tobacco use. Alcohol use: social drinker No drug use Exercise: moderately active   She does get adequate calcium and Vitamin D in her diet.  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  Doing better with it.      Past Medical History:  Diagnosis Date   Abnormal mammogram 11/10/2016   Allergy    seasonal   Atypical chest pain 09/22/2017   Degenerative disc disease at L5-S1 level    Environmental and seasonal allergies 09/01/2019   Herniated lumbar intervertebral disc    HTN (hypertension) 08/09/2019   Low back pain 08/09/2019   Mass of lower inner quadrant of left breast 11/17/2016   Migraine headache    Overweight (BMI 25.0-29.9) 08/09/2019   Palpitations 09/22/2017   Screening for colon cancer 10/2019   neg Cologuard; repeat due in 3 yrs    Past Surgical History:  Procedure Laterality Date   AUGMENTATION MAMMAPLASTY  2004   in [place], right breast was larger   BREAST  BIOPSY Left 11/18/2016   core bx Byrnett, neg   TUBAL LIGATION  2006    Family History  Problem Relation Age of Onset   Cervical cancer Maternal Aunt        30s/40s   Diabetes Paternal Grandmother    Lung cancer Paternal Grandmother    Lung cancer Paternal Grandfather    Thyroid disease Mother    Arthritis Mother    Hypertension Father    Hyperlipidemia Father    Arthritis Father    Heart attack Brother 82   Coronary artery disease Brother    COPD Brother    Coronary artery disease Maternal Grandfather    Anxiety disorder Daughter    Healthy Daughter    Anxiety disorder Daughter    Healthy Daughter    Breast cancer Neg Hx     Social History   Socioeconomic History   Marital status: Married    Spouse name: Not on file   Number of children: Not on file   Years of education: Not on file   Highest education level: Not on file  Occupational History   Not on file  Tobacco Use   Smoking status: Never   Smokeless tobacco: Never  Vaping Use   Vaping Use: Never used  Substance and Sexual Activity   Alcohol use: Yes    Alcohol/week:  3.0 standard drinks    Types: 3 Glasses of wine per week   Drug use: Never   Sexual activity: Yes    Birth control/protection: Surgical, Pill    Comment: Tubal ligation   Other Topics Concern   Not on file  Social History Narrative    Married 22 years. 2 girls- college and DTR in grad school. 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   Social Determinants of Health   Financial Resource Strain: Not on file  Food Insecurity: Not on file  Transportation Needs: Not on file  Physical Activity: Not on file  Stress: Not on file  Social Connections: Not on file  Intimate Partner Violence: Not on file    Current Outpatient Medications on File Prior to Visit  Medication Sig Dispense Refill   buPROPion (WELLBUTRIN XL) 150 MG 24 hr tablet Take 1 tablet (150 mg total) by mouth every evening. 30 tablet 5   buPROPion (WELLBUTRIN XL) 300  MG 24 hr tablet Take 1 tablet (300 mg total) by mouth every morning. 30 tablet 5   cetirizine (ZYRTEC) 10 MG tablet Take 10 mg by mouth as needed.      hydrochlorothiazide (MICROZIDE) 12.5 MG capsule Take 1 capsule (12.5 mg total) by mouth daily. 90 capsule 1   Olopatadine HCl 0.2 % SOLN as needed.      ondansetron (ZOFRAN ODT) 4 MG disintegrating tablet Take 1 tablet (4 mg total) by mouth every 6 (six) hours as needed for nausea. 30 tablet 0   No current facility-administered medications on file prior to visit.      ROS:  Review of Systems  Constitutional:  Negative for fatigue, fever and unexpected weight change.  Respiratory:  Negative for cough, shortness of breath and wheezing.   Cardiovascular:  Negative for chest pain, palpitations and leg swelling.  Gastrointestinal:  Negative for blood in stool, constipation, diarrhea, nausea and vomiting.  Endocrine: Negative for cold intolerance, heat intolerance and polyuria.  Genitourinary:  Negative for dyspareunia, dysuria, flank pain, frequency, genital sores, hematuria, menstrual problem, pelvic pain, urgency, vaginal bleeding, vaginal discharge and vaginal pain.  Musculoskeletal:  Positive for arthralgias. Negative for back pain, joint swelling and myalgias.  Skin:  Negative for rash.  Neurological:  Negative for dizziness, syncope, light-headedness, numbness and headaches.  Hematological:  Negative for adenopathy.  Psychiatric/Behavioral:  Positive for dysphoric mood. Negative for agitation, confusion, sleep disturbance and suicidal ideas. The patient is not nervous/anxious.     Objective: BP 120/70   Ht 5' 6.5" (1.689 m)   Wt 180 lb (81.6 kg)   LMP 12/16/2020 (Approximate)   BMI 28.62 kg/m    Physical Exam Constitutional:      Appearance: She is well-developed.  Genitourinary:     Vulva normal.     Right Labia: No rash, tenderness or lesions.    Left Labia: No tenderness, lesions or rash.    No vaginal discharge,  erythema or tenderness.      Right Adnexa: not tender and no mass present.    Left Adnexa: not tender and no mass present.    No cervical motion tenderness, friability or polyp.     Uterus is not enlarged or tender.  Breasts:    Right: No mass, nipple discharge, skin change or tenderness.     Left: No mass, nipple discharge, skin change or tenderness.  Neck:     Thyroid: No thyromegaly.  Cardiovascular:     Rate and Rhythm: Normal rate and  regular rhythm.     Heart sounds: Normal heart sounds. No murmur heard. Pulmonary:     Effort: Pulmonary effort is normal.     Breath sounds: Normal breath sounds.  Abdominal:     Palpations: Abdomen is soft.     Tenderness: There is no abdominal tenderness. There is no guarding or rebound.  Musculoskeletal:        General: Normal range of motion.     Cervical back: Normal range of motion.  Lymphadenopathy:     Cervical: No cervical adenopathy.  Neurological:     General: No focal deficit present.     Mental Status: She is alert and oriented to person, place, and time.     Cranial Nerves: No cranial nerve deficit.  Skin:    General: Skin is warm and dry.  Psychiatric:        Mood and Affect: Mood normal.        Behavior: Behavior normal.        Thought Content: Thought content normal.        Judgment: Judgment normal.  Vitals reviewed.    Assessment/Plan: Encounter for annual routine gynecological examination  Cervical cancer screening - Plan: Cytology - PAP  Screening for HPV (human papillomavirus) - Plan: Cytology - PAP  Encounter for surveillance of contraceptive pills - Plan: Drospirenone (SLYND) 4 MG TABS; Rx RF POPs.   Encounter for screening mammogram for malignant neoplasm of breast - Plan: MM 3D SCREEN BREAST BILATERAL; pt to sched mammo  Menorrhagia with regular cycle - Plan: Drospirenone (SLYND) 4 MG TABS; Rx RF POPs. Doing well. Also discussed IUD, pt wants to stay on POPs for now.    Meds ordered this encounter   Medications   Drospirenone (SLYND) 4 MG TABS    Sig: Take 1 tablet by mouth daily. CONTINUOUS DOSING    Dispense:  84 tablet    Refill:  3    Order Specific Question:   Supervising Provider    Answer:   Gae Dry [[id number]]         GYN counsel breast self exam, mammography screening, adequate intake of calcium and vitamin D, diet and exercise     F/U  Return in about 1 year (around 12/30/2021).  Jazen Spraggins B. Kanton Kamel, PA-C 12/30/2020 9:23 AM

## 2020-12-30 ENCOUNTER — Other Ambulatory Visit: Payer: Self-pay

## 2020-12-30 ENCOUNTER — Ambulatory Visit (INDEPENDENT_AMBULATORY_CARE_PROVIDER_SITE_OTHER): Payer: No Typology Code available for payment source | Admitting: Obstetrics and Gynecology

## 2020-12-30 ENCOUNTER — Encounter: Payer: Self-pay | Admitting: Obstetrics and Gynecology

## 2020-12-30 ENCOUNTER — Other Ambulatory Visit (HOSPITAL_COMMUNITY)
Admission: RE | Admit: 2020-12-30 | Discharge: 2020-12-30 | Disposition: A | Discharge status: Home / Self Care | Payer: No Typology Code available for payment source | Source: Ambulatory Visit | Attending: Obstetrics and Gynecology | Admitting: Obstetrics and Gynecology

## 2020-12-30 VITALS — BP 120/70 | Ht 66.5 in | Wt 180.0 lb

## 2020-12-30 DIAGNOSIS — Z1151 Encounter for screening for human papillomavirus (HPV): Secondary | ICD-10-CM | POA: Insufficient documentation

## 2020-12-30 DIAGNOSIS — Z1211 Encounter for screening for malignant neoplasm of colon: Secondary | ICD-10-CM

## 2020-12-30 DIAGNOSIS — F4321 Adjustment disorder with depressed mood: Secondary | ICD-10-CM

## 2020-12-30 DIAGNOSIS — Z3041 Encounter for surveillance of contraceptive pills: Secondary | ICD-10-CM | POA: Diagnosis not present

## 2020-12-30 DIAGNOSIS — Z124 Encounter for screening for malignant neoplasm of cervix: Secondary | ICD-10-CM | POA: Insufficient documentation

## 2020-12-30 DIAGNOSIS — Z01419 Encounter for gynecological examination (general) (routine) without abnormal findings: Secondary | ICD-10-CM | POA: Diagnosis not present

## 2020-12-30 DIAGNOSIS — N920 Excessive and frequent menstruation with regular cycle: Secondary | ICD-10-CM

## 2020-12-30 DIAGNOSIS — Z1231 Encounter for screening mammogram for malignant neoplasm of breast: Secondary | ICD-10-CM

## 2020-12-30 MED ORDER — SLYND 4 MG PO TABS: 1.0000 | ORAL_TABLET | Freq: Every day | ORAL | 3 refills | Status: DC

## 2020-12-30 NOTE — Patient Instructions (Signed)
I value your feedback and you entrusting [place] with your care. If you get a [facility] patient survey, I would appreciate you taking the time to let us know about your experience today. Thank you!  [place] [facility]: [phone]

## 2021-01-01 LAB — CYTOLOGY - PAP
Comment: NEGATIVE
Diagnosis: NEGATIVE
High risk HPV: NEGATIVE

## 2021-01-14 ENCOUNTER — Ambulatory Visit: Payer: PRIVATE HEALTH INSURANCE | Admitting: Family Medicine

## 2021-01-14 ENCOUNTER — Encounter: Payer: Self-pay | Admitting: Family Medicine

## 2021-01-14 ENCOUNTER — Other Ambulatory Visit: Payer: Self-pay

## 2021-01-14 VITALS — BP 122/81 | HR 84 | Temp 97.5°F | Resp 16 | Wt 176.0 lb

## 2021-01-14 DIAGNOSIS — F419 Anxiety disorder, unspecified: Secondary | ICD-10-CM | POA: Diagnosis not present

## 2021-01-14 DIAGNOSIS — F32A Depression, unspecified: Secondary | ICD-10-CM

## 2021-01-14 DIAGNOSIS — Z23 Encounter for immunization: Secondary | ICD-10-CM | POA: Diagnosis not present

## 2021-01-14 DIAGNOSIS — I10 Essential (primary) hypertension: Secondary | ICD-10-CM

## 2021-01-14 MED ORDER — HYDROCHLOROTHIAZIDE 12.5 MG PO CAPS: 12.5000 mg | ORAL_CAPSULE | Freq: Every day | ORAL | 4 refills | Status: DC

## 2021-01-14 MED ORDER — BUPROPION HCL ER (XL) 150 MG PO TB24: 150.0000 mg | ORAL_TABLET | Freq: Every evening | ORAL | 4 refills | Status: DC

## 2021-01-14 MED ORDER — BUPROPION HCL ER (XL) 300 MG PO TB24: 300.0000 mg | ORAL_TABLET | Freq: Every morning | ORAL | 4 refills | Status: DC

## 2021-01-14 NOTE — Progress Notes (Signed)
Established patient visit   Patient: Lori Dawson   DOB: [birth year]   45 y.o. Female  MRN: [id number] Visit Date: 01/14/2021  Today's healthcare provider: Lelon Huh, MD   Chief Complaint  Patient presents with   Anxiety   Depression   Hypertension   Subjective  -------------------------------------------------------------------------------------------------------------------- HPI  Anxiety and Depression, Follow-up  She was last seen for anxiety and depression 3 months ago. Changes made at last visit include changing Bupropion to XL formulation and added an additional 181m a day. .   She reports good compliance with treatment. She reports good tolerance of treatment. She is not having side effects.   She feels her anxiety is mild and Improved since last visit. She feels the depression is much better since adding additional 1513mof bupropion and wishes to continue current medication regiment.   Symptoms: No chest pain No difficulty concentrating  No dizziness No fatigue  No feelings of losing control No insomnia  No irritable No palpitations  No panic attacks No racing thoughts  No shortness of breath No sweating  No tremors/shakes    GAD-7 Results GAD-7 Generalized Anxiety Disorder Screening Tool 08/09/2019  1. Feeling Nervous, Anxious, or on Edge 1  2. Not Being Able to Stop or Control Worrying 1  3. Worrying Too Much About Different Things 1  4. Trouble Relaxing 2  5. Being So Restless it's Hard To Sit Still 1  6. Becoming Easily Annoyed or Irritable 1  7. Feeling Afraid As If Something Awful Might Happen 2  Total GAD-7 Score 9    PHQ-9 Scores PHQ9 SCORE ONLY 01/14/2021 10/03/2020 08/08/2020  PHQ-9 Total Score 8 6 10     ---------------------------------------------------------------------------------------------------   Hypertension, follow-up  BP Readings from Last 3 Encounters:  01/14/21 122/81  12/30/20 120/70  10/03/20 113/77   Wt Readings  from Last 3 Encounters:  01/14/21 176 lb (79.8 kg)  12/30/20 180 lb (81.6 kg)  10/03/20 186 lb (84.4 kg)     She was last seen for hypertension 3 months ago.  BP at that visit was 113/77. Management since that visit includes continue same medication.  She reports good compliance with treatment. She is not having side effects.  She is following a Regular diet. She is exercising. She does not smoke.  Use of agents associated with hypertension: none.   Outside blood pressures are not checked. Symptoms: No chest pain No chest pressure  No palpitations No syncope  No dyspnea No orthopnea  No paroxysmal nocturnal dyspnea No lower extremity edema   Pertinent labs: Lab Results  Component Value Date   CHOL 190 08/08/2020   HDL 43 08/08/2020   LDLCALC 131 (H) 08/08/2020   TRIG 86 08/08/2020   CHOLHDL 4.4 08/08/2020   Lab Results  Component Value Date   NA 137 10/03/2020   K 4.4 10/03/2020   CREATININE 0.85 10/03/2020   GFRNONAA 93 10/19/2018   GFRAA 107 10/19/2018   GLUCOSE 102 (H) 10/03/2020     The 10-year ASCVD risk score (GMikey BussingC Jr., et al., 2013) is: 1.5%   ---------------------------------------------------------------------------------------------------     Medications: Outpatient Medications Prior to Visit  Medication Sig   cetirizine (ZYRTEC) 10 MG tablet Take 10 mg by mouth as needed.    Drospirenone (SLYND) 4 MG TABS Take 1 tablet by mouth daily. CONTINUOUS DOSING   Olopatadine HCl 0.2 % SOLN as needed.    ondansetron (ZOFRAN ODT) 4 MG disintegrating tablet Take 1 tablet (4 mg total)  by mouth every 6 (six) hours as needed for nausea.   [DISCONTINUED] buPROPion (WELLBUTRIN XL) 150 MG 24 hr tablet Take 1 tablet (150 mg total) by mouth every evening.   [DISCONTINUED] buPROPion (WELLBUTRIN XL) 300 MG 24 hr tablet Take 1 tablet (300 mg total) by mouth every morning.   [DISCONTINUED] hydrochlorothiazide (MICROZIDE) 12.5 MG capsule Take 1 capsule (12.5 mg total) by  mouth daily.   No facility-administered medications prior to visit.    Review of Systems  Constitutional:  Negative for appetite change, chills, fatigue and fever.  Respiratory:  Negative for chest tightness and shortness of breath.   Cardiovascular:  Negative for chest pain and palpitations.  Gastrointestinal:  Negative for abdominal pain, nausea and vomiting.  Neurological:  Negative for dizziness and weakness.      Objective  -------------------------------------------------------------------------------------------------------------------- BP 122/81 (BP Location: Left Arm, Patient Position: Sitting, Cuff Size: Normal)   Pulse 84   Temp (!) 97.5 F (36.4 C) (Temporal)   Resp 16   Wt 176 lb (79.8 kg)   LMP 12/16/2020 (Approximate)   BMI 27.98 kg/m  Physical Exam   General: Appearance:     Well developed, well nourished female in no acute distress  Eyes:    PERRL, conjunctiva/corneas clear, EOM's intact       Lungs:     Clear to auscultation bilaterally, respirations unlabored  Heart:    Normal heart rate. Normal rhythm. No murmurs, rubs, or gallops.    MS:   All extremities are intact.    Neurologic:   Awake, alert, oriented x 3. No apparent focal neurological defect.        Assessment & Plan  ---------------------------------------------------------------------------------------------------------------------- 1. Anxiety and depression Doing well off of sertraline and with additional 135m bupropion. Continue current medications.  Refill: - buPROPion (WELLBUTRIN XL) 150 MG 24 hr tablet; Take 1 tablet (150 mg total) by mouth every evening.  Dispense: 90 tablet; Refill: 4 - buPROPion (WELLBUTRIN XL) 300 MG 24 hr tablet; Take 1 tablet (300 mg total) by mouth every morning.  Dispense: 90 tablet; Refill: 4  2. Need for influenza vaccination  - Flu Vaccine QUAD 36+ mos IM (Fluarix/Fluzone)  3. Primary hypertension Well controlled.  refill hydrochlorothiazide (MICROZIDE) 12.5  MG capsule; Take 1 capsule (12.5 mg total) by mouth daily.  Dispense: 90 capsule; Refill: 4         The entirety of the information documented in the History of Present Illness, Review of Systems and Physical Exam were personally obtained by me. Portions of this information were initially documented by the CMA and reviewed by me for thoroughness and accuracy.     DLelon Huh MD  [phone](phone) [phone](fax)  [place]

## 2021-01-22 ENCOUNTER — Ambulatory Visit
Admission: RE | Admit: 2021-01-22 | Discharge: 2021-01-22 | Disposition: A | Discharge status: Home / Self Care | Payer: PRIVATE HEALTH INSURANCE | Source: Ambulatory Visit | Attending: Obstetrics and Gynecology | Admitting: Obstetrics and Gynecology

## 2021-01-22 ENCOUNTER — Other Ambulatory Visit: Payer: Self-pay

## 2021-01-22 DIAGNOSIS — Z1231 Encounter for screening mammogram for malignant neoplasm of breast: Secondary | ICD-10-CM | POA: Diagnosis not present

## 2021-02-27 ENCOUNTER — Ambulatory Visit: Payer: PRIVATE HEALTH INSURANCE | Admitting: Family Medicine

## 2021-04-28 ENCOUNTER — Encounter: Payer: Self-pay | Admitting: Obstetrics and Gynecology

## 2021-04-29 MED ORDER — NORETHINDRONE ACETATE 5 MG PO TABS: 5.0000 mg | ORAL_TABLET | Freq: Every day | ORAL | 0 refills | Status: DC

## 2021-05-06 ENCOUNTER — Ambulatory Visit: Payer: Self-pay | Admitting: *Deleted

## 2021-05-06 NOTE — Telephone Encounter (Signed)
° °  Chief Complaint: no chest pain now. Sunday c/o chest pain, tightness, felt like heart attack and requesting appt. Symptoms: none now. Sunday 05/03/21 c/o chest tightness, full feeling, hot flashes, nausea, neck pain , "heart burn" getting worse Frequency: happened 3 days ago  Pertinent Negatives: Patient denies symptoms now, no dizziness chest pain at this time  Disposition: [] ED /[] Urgent Care (no appt availability in office) / [x] Appointment(In office/virtual)/ []  [facility]/ [] Home Care/ [] Refused Recommended Disposition  Additional Notes:  Appt scheduled for  05/07/21 for evaluation . Care advise given if symptoms return go to ED.     Reason for Disposition  [1] Chest pain lasts > 5 minutes AND [2] occurred > 3 days ago (72 hours) AND [3] NO chest pain or cardiac symptoms now  Answer Assessment - Initial Assessment Questions 1. LOCATION: "Where does it hurt?"       Chest  on Sunday 05/03/21 2. RADIATION: "Does the pain go anywhere else?" (e.g., into neck, jaw, arms, back)     Tight and fullness chest to neck 3. ONSET: "When did the chest pain begin?" (Minutes, hours or days)      Sunday 05/03/21 4. PATTERN "Does the pain come and go, or has it been constant since it started?"  "Does it get worse with exertion?"      Gone now , was constant  5. DURATION: "How long does it last" (e.g., seconds, minutes, hours)     na 6. SEVERITY: "How bad is the pain?"  (e.g., Scale 1-10; mild, moderate, or severe)    - MILD (1-3): doesn't interfere with normal activities     - MODERATE (4-7): interferes with normal activities or awakens from sleep    - SEVERE (8-10): excruciating pain, unable to do any normal activities       Na  7. CARDIAC RISK FACTORS: "Do you have any history of heart problems or risk factors for heart disease?" (e.g., angina, prior heart attack; diabetes, high blood pressure, high cholesterol, smoker, or strong family history of heart disease)     Family hx  brother died of heart attack 8. PULMONARY RISK FACTORS: "Do you have any history of lung disease?"  (e.g., blood clots in lung, asthma, emphysema, birth control pills)     na 9. CAUSE: "What do you think is causing the chest pain?"     Not sure heart attack  10. OTHER SYMPTOMS: "Do you have any other symptoms?" (e.g., dizziness, nausea, vomiting, sweating, fever, difficulty breathing, cough)       No symptoms now , Sunday hot flashes, nausea, chest tightness and fullness, felt like "heartburn" 11. PREGNANCY: "Is there any chance you are pregnant?" "When was your last menstrual period?"       na  Protocols used: Chest Pain-A-AH

## 2021-05-07 ENCOUNTER — Other Ambulatory Visit: Payer: Self-pay

## 2021-05-07 ENCOUNTER — Encounter: Payer: Self-pay | Admitting: Physician Assistant

## 2021-05-07 ENCOUNTER — Ambulatory Visit: Payer: PRIVATE HEALTH INSURANCE | Admitting: Physician Assistant

## 2021-05-07 VITALS — BP 138/83 | HR 77 | Temp 98.7°F | Wt 166.0 lb

## 2021-05-07 DIAGNOSIS — R079 Chest pain, unspecified: Secondary | ICD-10-CM | POA: Diagnosis not present

## 2021-05-07 NOTE — Progress Notes (Signed)
Established patient visit   Patient: Lori Dawson   DOB: Sep 18, 1975   45 y.o. Female  MRN: [id number] Visit Date: 05/07/2021  Today's healthcare provider: Dani Gobble Cherylee Rawlinson, PA-C   Introduced myself to the patient as a Journalist, newspaper and provided education on APPs in clinical practice.    Chief Complaint  Patient presents with   Chest Pain   Subjective    Chest Pain  This is a new problem. Episode onset: Chest pain started 05/03/2021.  Lasted about 3-4 minutes (The most intense part) The problem has been resolved. The quality of the pain is described as tightness and squeezing. The pain does not radiate. Associated symptoms include diaphoresis, dizziness and nausea. Pertinent negatives include no abdominal pain, fever, headaches, palpitations or vomiting.     States initially began as a heartburn sensation and fullness in her chest.  Had nausea, no chest tightness or SOB  Denies jaw pain, back pain during event Unable to say if chest pain was reproducible.   Medications: Outpatient Medications Prior to Visit  Medication Sig   buPROPion (WELLBUTRIN XL) 150 MG 24 hr tablet Take 1 tablet (150 mg total) by mouth every evening.   buPROPion (WELLBUTRIN XL) 300 MG 24 hr tablet Take 1 tablet (300 mg total) by mouth every morning.   cetirizine (ZYRTEC) 10 MG tablet Take 10 mg by mouth as needed.    Drospirenone (SLYND) 4 MG TABS Take 1 tablet by mouth daily. CONTINUOUS DOSING   hydrochlorothiazide (MICROZIDE) 12.5 MG capsule Take 1 capsule (12.5 mg total) by mouth daily.   norethindrone (AYGESTIN) 5 MG tablet Take 1 tablet (5 mg total) by mouth daily for 14 days.   Olopatadine HCl 0.2 % SOLN as needed.    ondansetron (ZOFRAN ODT) 4 MG disintegrating tablet Take 1 tablet (4 mg total) by mouth every 6 (six) hours as needed for nausea.   No facility-administered medications prior to visit.    Review of Systems  Constitutional:  Positive for diaphoresis. Negative for activity change, appetite  change, chills, fatigue, fever and unexpected weight change.  Respiratory: Negative.    Cardiovascular:  Positive for chest pain (resolved since 05/03/2021). Negative for palpitations and leg swelling.  Gastrointestinal:  Positive for nausea. Negative for abdominal distention, abdominal pain, anal bleeding, blood in stool, constipation, diarrhea, rectal pain and vomiting.  Neurological:  Positive for dizziness. Negative for light-headedness and headaches.      Objective    BP 138/83 (BP Location: Right Arm, Patient Position: Sitting, Cuff Size: Large)    Pulse 77    Temp 98.7 F (37.1 C) (Oral)    Wt 166 lb (75.3 kg)    SpO2 100%    BMI 26.39 kg/m    Physical Exam Constitutional:      Appearance: She is well-developed.  HENT:     Head: Normocephalic and atraumatic.  Eyes:     Extraocular Movements: Extraocular movements intact.     Pupils: Pupils are equal, round, and reactive to light.  Cardiovascular:     Rate and Rhythm: Normal rate and regular rhythm.     Pulses:          Radial pulses are 2+ on the right side and 2+ on the left side.     Heart sounds: Normal heart sounds.  Pulmonary:     Effort: Pulmonary effort is normal.     Breath sounds: Normal breath sounds. No decreased breath sounds, wheezing, rhonchi or rales.  Abdominal:  General: Bowel sounds are normal.     Palpations: Abdomen is soft.  Musculoskeletal:        General: Normal range of motion.     Cervical back: Normal range of motion and neck supple.  Skin:    General: Skin is warm and dry.  Neurological:     Mental Status: She is alert.      No results found for any visits on 05/07/21.  Assessment & Plan     Problem List Items Addressed This Visit       Other   Chest pain - Primary    Acute occurrence on 05/03/2021 with resolution at office visit EKG performed in office - Normal sinus rhythm, rate approx 70  Discussed monitoring lipid panel to prevent cardiovascular disease at annual  wellness/physical Discussed management of heartburn symptoms and potential need for follow up if it remains a concern.  Discussed incorporating heart healthy diet and exercise to prevent CV disease.  Discussed symptoms indicative of heart attack and ER precautions       Relevant Orders   EKG 12-Lead (Completed)     No follow-ups on file.   I, Yael Angerer E Rilynne Lonsway, PA-C, have reviewed all documentation for this visit. The documentation on 05/07/21 for the exam, diagnosis, procedures, and orders are all accurate and complete.   Caitlain Tweed, [place] MPH [facility]    No follow-ups on file.         Almon Register, PA-C  [employer] [phone] (phone) [phone] (fax)  [place]

## 2021-05-07 NOTE — Assessment & Plan Note (Addendum)
Acute occurrence on 05/03/2021 with resolution at office visit EKG performed in office - Normal sinus rhythm, rate approx 70  Discussed monitoring lipid panel to prevent cardiovascular disease at annual wellness/physical Discussed management of heartburn symptoms and potential need for follow up if it remains a concern.  Discussed incorporating heart healthy diet and exercise to prevent CV disease.  Discussed symptoms indicative of heart attack and ER precautions

## 2021-05-07 NOTE — Patient Instructions (Addendum)
Based on your exam and EKG it does not appear that you had a heart attack  I have included information on heart attacks including signs and symptoms to look for that would necessitate an ER visit.  We will check your cholesterol at your next follow up / physical to monitor which can help prevent cardiovascular disease  Please review the information provided on diet and exercise to help improve cardiovascular health and thus help prevent heart attacks

## 2021-09-22 ENCOUNTER — Other Ambulatory Visit: Payer: Self-pay | Admitting: Obstetrics and Gynecology

## 2021-09-23 MED ORDER — NORETHINDRONE ACETATE 5 MG PO TABS: 5.0000 mg | ORAL_TABLET | Freq: Every day | ORAL | 0 refills | Status: DC

## 2021-11-09 ENCOUNTER — Other Ambulatory Visit: Payer: Self-pay | Admitting: Obstetrics and Gynecology

## 2021-11-09 DIAGNOSIS — Z3041 Encounter for surveillance of contraceptive pills: Secondary | ICD-10-CM

## 2021-11-09 DIAGNOSIS — N920 Excessive and frequent menstruation with regular cycle: Secondary | ICD-10-CM

## 2021-12-16 ENCOUNTER — Other Ambulatory Visit: Payer: Self-pay

## 2021-12-16 DIAGNOSIS — Z3041 Encounter for surveillance of contraceptive pills: Secondary | ICD-10-CM

## 2021-12-16 DIAGNOSIS — N920 Excessive and frequent menstruation with regular cycle: Secondary | ICD-10-CM

## 2021-12-16 MED ORDER — SLYND 4 MG PO TABS: 1.0000 | ORAL_TABLET | Freq: Every day | ORAL | 0 refills | Status: DC

## 2021-12-19 ENCOUNTER — Other Ambulatory Visit: Payer: Self-pay | Admitting: Family Medicine

## 2021-12-19 DIAGNOSIS — I10 Essential (primary) hypertension: Secondary | ICD-10-CM

## 2021-12-19 DIAGNOSIS — F32A Depression, unspecified: Secondary | ICD-10-CM

## 2021-12-20 NOTE — Telephone Encounter (Signed)
Have sent refills to her mail order, but she is due follow up office visit.   Please schedule follow up sometime in the next 1-2 months.

## 2022-02-22 NOTE — Progress Notes (Unsigned)
No chief complaint on file.    HPI:      [name] is a 46 y.o. G2P0 who LMP was No LMP recorded. (Menstrual status: Oral contraceptives)., presents today for her annual examination. Her menses are Q3 months with slynd continuous dosing, lasting 5-7 days, 1 heavy day, no BTB. Does have dysmen on heavy day, improved with NSAIDs.  Being treated for menometrorrhagia with POPs, used be on OCPs but changed 5/21 due to HTN dx. Had neg DUB eval 2019 with labs and u/s.   Sex activity: single partner, contraception - tubal ligation.  Last Pap: 12/30/20 Results were: no abnormalities /neg HPV DNA  Hx of STDs: none   Last mammogram: 01/22/21 Results were: normal; repeat in 12 months.  There is no FH of breast cancer. There is no FH of ovarian cancer. The patient does do self-breast exams.   Tobacco use: The patient denies current or previous tobacco use. Alcohol use: social drinker No drug use Exercise: moderately active   She does get adequate calcium and Vitamin D in her diet.  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  Doing better with it.      Past Medical History:  Diagnosis Date   Abnormal mammogram 11/10/2016   Allergy    seasonal   Atypical chest pain 09/22/2017   Degenerative disc disease at L5-S1 level    Environmental and seasonal allergies 09/01/2019   Herniated lumbar intervertebral disc    HTN (hypertension) 08/09/2019   Low back pain 08/09/2019   Mass of lower inner quadrant of left breast 11/17/2016   Migraine headache    Overweight (BMI 25.0-29.9) 08/09/2019   Palpitations 09/22/2017   Screening for colon cancer 10/2019   neg Cologuard; repeat due in 3 yrs    Past Surgical History:  Procedure Laterality Date   AUGMENTATION MAMMAPLASTY  2004   in [place], right breast was larger   BREAST BIOPSY Left 11/18/2016   core bx Byrnett, neg   TUBAL  LIGATION  2006    Family History  Problem Relation Age of Onset   Cervical cancer Maternal Aunt        30s/40s   Diabetes Paternal Grandmother    Lung cancer Paternal Grandmother    Lung cancer Paternal Grandfather    Thyroid disease Mother    Arthritis Mother    Hypertension Father    Hyperlipidemia Father    Arthritis Father    Heart attack Brother 60   Coronary artery disease Brother    COPD Brother    Coronary artery disease Maternal Grandfather    Anxiety disorder Daughter    Healthy Daughter    Anxiety disorder Daughter    Healthy Daughter    Breast cancer Neg Hx     Social History   Socioeconomic History   Marital status: Married    Spouse name: Not on file   Number of children: Not on file   Years of education: Not on file   Highest education level: Not on file  Occupational History   Not on file  Tobacco Use   Smoking status: Never   Smokeless tobacco: Never  Vaping Use   Vaping Use: Never used  Substance and Sexual Activity   Alcohol use: Yes    Alcohol/week: 3.0 standard drinks of alcohol    Types: 3 Glasses of wine  per week   Drug use: Never   Sexual activity: Yes    Birth control/protection: Surgical, Pill    Comment: Tubal ligation   Other Topics Concern   Not on file  Social History Narrative    Married 22 years. 2 girls- college and DTR in grad school. 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   Social Determinants of Health   Financial Resource Strain: Not on file  Food Insecurity: Not on file  Transportation Needs: Not on file  Physical Activity: Not on file  Stress: Not on file  Social Connections: Not on file  Intimate Partner Violence: Not on file    Current Outpatient Medications on File Prior to Visit  Medication Sig Dispense Refill   buPROPion (WELLBUTRIN XL) 150 MG 24 hr tablet Take 1 tablet (150 mg total) by mouth every evening. 90 tablet 4   buPROPion (WELLBUTRIN XL) 300 MG 24 hr tablet Take 1 tablet (300 mg total)  by mouth every morning. 90 tablet 4   cetirizine (ZYRTEC) 10 MG tablet Take 10 mg by mouth as needed.      Drospirenone (SLYND) 4 MG TABS Take 1 tablet by mouth daily. CONTINUOUS DOSING 84 tablet 0   hydrochlorothiazide (MICROZIDE) 12.5 MG capsule Take 1 capsule (12.5 mg total) by mouth daily. 90 capsule 4   norethindrone (AYGESTIN) 5 MG tablet Take 1 tablet (5 mg total) by mouth daily for 14 days. 14 tablet 0   Olopatadine HCl 0.2 % SOLN as needed.      ondansetron (ZOFRAN ODT) 4 MG disintegrating tablet Take 1 tablet (4 mg total) by mouth every 6 (six) hours as needed for nausea. 30 tablet 0   No current facility-administered medications on file prior to visit.      ROS:  Review of Systems  Constitutional:  Negative for fatigue, fever and unexpected weight change.  Respiratory:  Negative for cough, shortness of breath and wheezing.   Cardiovascular:  Negative for chest pain, palpitations and leg swelling.  Gastrointestinal:  Negative for blood in stool, constipation, diarrhea, nausea and vomiting.  Endocrine: Negative for cold intolerance, heat intolerance and polyuria.  Genitourinary:  Negative for dyspareunia, dysuria, flank pain, frequency, genital sores, hematuria, menstrual problem, pelvic pain, urgency, vaginal bleeding, vaginal discharge and vaginal pain.  Musculoskeletal:  Positive for arthralgias. Negative for back pain, joint swelling and myalgias.  Skin:  Negative for rash.  Neurological:  Negative for dizziness, syncope, light-headedness, numbness and headaches.  Hematological:  Negative for adenopathy.  Psychiatric/Behavioral:  Positive for dysphoric mood. Negative for agitation, confusion, sleep disturbance and suicidal ideas. The patient is not nervous/anxious.      Objective: There were no vitals taken for this visit.   Physical Exam Constitutional:      Appearance: She is well-developed.  Genitourinary:     Vulva normal.     Right Labia: No rash, tenderness  or lesions.    Left Labia: No tenderness, lesions or rash.    No vaginal discharge, erythema or tenderness.      Right Adnexa: not tender and no mass present.    Left Adnexa: not tender and no mass present.    No cervical motion tenderness, friability or polyp.     Uterus is not enlarged or tender.  Breasts:    Right: No mass, nipple discharge, skin change or tenderness.     Left: No mass, nipple discharge, skin change or tenderness.  Neck:     Thyroid: No thyromegaly.  Cardiovascular:     Rate and Rhythm: Normal rate and regular rhythm.     Heart sounds: Normal heart sounds. No murmur heard. Pulmonary:     Effort: Pulmonary effort is normal.     Breath sounds: Normal breath sounds.  Abdominal:     Palpations: Abdomen is soft.     Tenderness: There is no abdominal tenderness. There is no guarding or rebound.  Musculoskeletal:        General: Normal range of motion.     Cervical back: Normal range of motion.  Lymphadenopathy:     Cervical: No cervical adenopathy.  Neurological:     General: No focal deficit present.     Mental Status: She is alert and oriented to person, place, and time.     Cranial Nerves: No cranial nerve deficit.  Skin:    General: Skin is warm and dry.  Psychiatric:        Mood and Affect: Mood normal.        Behavior: Behavior normal.        Thought Content: Thought content normal.        Judgment: Judgment normal.  Vitals reviewed.     Assessment/Plan: Encounter for annual routine gynecological examination  Cervical cancer screening - Plan: Cytology - PAP  Screening for HPV (human papillomavirus) - Plan: Cytology - PAP  Encounter for surveillance of contraceptive pills - Plan: Drospirenone (SLYND) 4 MG TABS; Rx RF POPs.   Encounter for screening mammogram for malignant neoplasm of breast - Plan: MM 3D SCREEN BREAST BILATERAL; pt to sched mammo  Menorrhagia with regular cycle - Plan: Drospirenone (SLYND) 4 MG TABS; Rx RF POPs. Doing well. Also  discussed IUD, pt wants to stay on POPs for now.    No orders of the defined types were placed in this encounter.        GYN counsel breast self exam, mammography screening, adequate intake of calcium and vitamin D, diet and exercise     F/U  No follow-ups on file.  Rebbie Lauricella B. Jolette Lana, PA-C 02/22/2022 12:34 PM

## 2022-02-23 ENCOUNTER — Ambulatory Visit (INDEPENDENT_AMBULATORY_CARE_PROVIDER_SITE_OTHER): Payer: Commercial Managed Care - PPO | Admitting: Obstetrics and Gynecology

## 2022-02-23 ENCOUNTER — Encounter: Payer: Self-pay | Admitting: Obstetrics and Gynecology

## 2022-02-23 ENCOUNTER — Other Ambulatory Visit: Payer: Self-pay | Admitting: Family Medicine

## 2022-02-23 VITALS — BP 110/70 | Ht 66.0 in | Wt 169.0 lb

## 2022-02-23 DIAGNOSIS — Z01411 Encounter for gynecological examination (general) (routine) with abnormal findings: Secondary | ICD-10-CM | POA: Diagnosis not present

## 2022-02-23 DIAGNOSIS — F32A Depression, unspecified: Secondary | ICD-10-CM

## 2022-02-23 DIAGNOSIS — N921 Excessive and frequent menstruation with irregular cycle: Secondary | ICD-10-CM

## 2022-02-23 DIAGNOSIS — Z01419 Encounter for gynecological examination (general) (routine) without abnormal findings: Secondary | ICD-10-CM

## 2022-02-23 DIAGNOSIS — Z3041 Encounter for surveillance of contraceptive pills: Secondary | ICD-10-CM

## 2022-02-23 DIAGNOSIS — Z1231 Encounter for screening mammogram for malignant neoplasm of breast: Secondary | ICD-10-CM

## 2022-02-23 DIAGNOSIS — F419 Anxiety disorder, unspecified: Secondary | ICD-10-CM

## 2022-02-23 DIAGNOSIS — Z1329 Encounter for screening for other suspected endocrine disorder: Secondary | ICD-10-CM | POA: Diagnosis not present

## 2022-02-23 MED ORDER — NORETHINDRONE ACETATE 5 MG PO TABS: 5.0000 mg | ORAL_TABLET | Freq: Every day | ORAL | 3 refills | Status: DC

## 2022-02-23 NOTE — Patient Instructions (Signed)
I value your feedback and you entrusting us with your care. If you get a [facility] patient survey, I would appreciate you taking the time to let us know about your experience today. Thank you!  [facility]: [phone]      

## 2022-02-24 LAB — T4, FREE: Free T4: 1.24 ng/dL (ref 0.82–1.77)

## 2022-02-24 LAB — TSH: TSH: 1.19 u[IU]/mL (ref 0.450–4.500)

## 2022-04-06 NOTE — Progress Notes (Unsigned)
I,[name],acting as a scribe for Lori Huh, MD.,have documented all relevant documentation on the behalf of Lori Huh, MD,as directed by  Lori Huh, MD while in the presence of Lori Huh, MD.    Established patient visit   Patient: Lori Dawson   DOB: 1976/01/25   46 y.o. Female  MRN: [id number] Visit Date: 04/07/2022  Today's healthcare provider: Lelon Huh, MD   Chief Complaint  Patient presents with   Anxiety   Depression   Hypertension   Subjective    HPI  Anxiety and Depression Follow-up:  She was last seen for anxiety 01/14/2021. Changes made at last visit include; Continue current medications.  Had previously increased bupropion to 447m every day which she felt has been very helpful.   She reports good compliance with treatment. She reports good tolerance of treatment. She is not having side effects.   She feels her anxiety is moderate and Unchanged since last visit.  Symptoms: No chest pain No difficulty concentrating  No dizziness No fatigue  No feelings of losing control No insomnia  No irritable No palpitations  No panic attacks No racing thoughts  No shortness of breath No sweating  No tremors/shakes    GAD-7 Results    08/09/2019    1:15 PM  GAD-7 Generalized Anxiety Disorder Screening Tool  1. Feeling Nervous, Anxious, or on Edge 1  2. Not Being Able to Stop or Control Worrying 1  3. Worrying Too Much About Different Things 1  4. Trouble Relaxing 2  5. Being So Restless it's Hard To Sit Still 1  6. Becoming Easily Annoyed or Irritable 1  7. Feeling Afraid As If Something Awful Might Happen 2  Total GAD-7 Score 9    PHQ-9 Scores    04/07/2022    8:08 AM 01/14/2021    8:17 AM 10/03/2020    8:17 AM  PHQ9 SCORE ONLY  PHQ-9 Total Score _0 --------------------------------------------------------------------------------------------------- Hypertension, follow-up  BP Readings from Last 3 Encounters:   04/07/22 123/85  02/23/22 110/70  05/07/21 138/83   Wt Readings from Last 3 Encounters:  04/07/22 176 lb 8 oz (80.1 kg)  02/23/22 169 lb (76.7 kg)  05/07/21 166 lb (75.3 kg)     She was last seen for hypertension 01/14/2021.  Management since that visit includes; Well controlled.   .  She reports good compliance with treatment. She is not having side effects.  She is following a Regular diet. She is not exercising. She does not smoke.  Use of agents associated with hypertension: none.   Outside blood pressures are 140/90's, but she only checks BP when she gets a headaches which is  typically once or twice a week.  Symptoms: No chest pain No chest pressure  No palpitations No syncope  No dyspnea No orthopnea  No paroxysmal nocturnal dyspnea No lower extremity edema   Pertinent labs Lab Results  Component Value Date   CHOL 190 08/08/2020   HDL 43 08/08/2020   LDLCALC 131 (H) 08/08/2020   TRIG 86 08/08/2020   CHOLHDL 4.4 08/08/2020   Lab Results  Component Value Date   NA 137 10/03/2020   K 4.4 10/03/2020   CREATININE 0.85 10/03/2020   EGFR 86 10/03/2020   GLUCOSE 102 (H) 10/03/2020   TSH 1.190 02/23/2022     The 10-year ASCVD risk score (Arnett DK, et al., 2019) is: 1.7%  ---------------------------------------------------------------------------------------------------   Medications: Outpatient Medications Prior to Visit  Medication Sig   buPROPion (WELLBUTRIN XL) 150 MG 24 hr tablet TAKE 1 TABLET BY MOUTH EVERY EVENING.   buPROPion (WELLBUTRIN XL) 300 MG 24 hr tablet Take 1 tablet (300 mg total) by mouth every morning. PATIENT NEEDS  OFFICE VISIT FOR FOLLOW UP   cetirizine (ZYRTEC) 10 MG tablet Take 10 mg by mouth as needed.    hydrochlorothiazide (MICROZIDE) 12.5 MG capsule Take 1 capsule (12.5 mg total) by mouth daily.   norethindrone (AYGESTIN) 5 MG tablet Take 1 tablet (5 mg total) by mouth daily.   ondansetron (ZOFRAN ODT) 4 MG disintegrating tablet  Take 1 tablet (4 mg total) by mouth every 6 (six) hours as needed for nausea.   No facility-administered medications prior to visit.    Review of Systems  Constitutional:  Negative for appetite change, chills, fatigue and fever.  Respiratory:  Negative for chest tightness and shortness of breath.   Cardiovascular:  Negative for chest pain and palpitations.  Gastrointestinal:  Negative for abdominal pain, nausea and vomiting.  Neurological:  Negative for dizziness and weakness.       Objective    BP 123/85 (BP Location: Left Arm, Patient Position: Sitting, Cuff Size: Large)   Pulse 85   Temp 98.3 F (36.8 C) (Oral)   Resp 18   Wt 176 lb 8 oz (80.1 kg)   LMP 02/24/2022   SpO2 100% Comment: room air  BMI 28.49 kg/m    Physical Exam  General appearance: Well developed, well nourished female, cooperative and in no acute distress Head: Normocephalic, without obvious abnormality, atraumatic Respiratory: Respirations even and unlabored, normal respiratory rate Extremities: All extremities are intact.  Skin: Skin color, texture, turgor normal. No rashes seen  Psych: Appropriate mood and affect. Neurologic: Mental status: Alert, oriented to person, place, and time, thought content appropriate.   Assessment & Plan     1. Primary hypertension Well controlled in office, but home Bps typically elevated but she only checks it when she has a headache. Recommend she check daily for the next week or two establish better home blood pressure record.   - CBC - Comprehensive metabolic panel - Lipid panel  refill hydrochlorothiazide (MICROZIDE) 12.5 MG capsule; Take 1 capsule (12.5 mg total) by mouth daily.  Dispense: 90 capsule; Refill: 4 Considering her frequent headaches, may do better with addition of a CCB. Will see how labs and home BP monitoring looks.   2. Anxiety and depression Doing very well current medications  refill - buPROPion (WELLBUTRIN XL) 300 MG 24 hr tablet; Take 1  tablet (300 mg total) by mouth every morning.  Dispense: 90 tablet; Refill: 2 And - buPROPion (WELLBUTRIN XL) 150 MG 24 hr tablet; Take 1 tablet (150 mg total) by mouth every evening.  Dispense: 90 tablet; Refill: 2  3. Frequent headaches She relates this to stressful situations. Unclear if this is causally related to BP, but will consider CCB      The entirety of the information documented in the History of Present Illness, Review of Systems and Physical Exam were personally obtained by me. Portions of this information were initially documented by the CMA and reviewed by me for thoroughness and accuracy.     Lori Huh, MD  [facility] [phone] (phone) [phone] (fax)  [place]

## 2022-04-07 ENCOUNTER — Ambulatory Visit (INDEPENDENT_AMBULATORY_CARE_PROVIDER_SITE_OTHER): Payer: Commercial Managed Care - PPO | Admitting: Family Medicine

## 2022-04-07 ENCOUNTER — Encounter: Payer: Self-pay | Admitting: Family Medicine

## 2022-04-07 VITALS — BP 123/85 | HR 85 | Temp 98.3°F | Resp 18 | Wt 176.5 lb

## 2022-04-07 DIAGNOSIS — I10 Essential (primary) hypertension: Secondary | ICD-10-CM

## 2022-04-07 DIAGNOSIS — R519 Headache, unspecified: Secondary | ICD-10-CM | POA: Diagnosis not present

## 2022-04-07 DIAGNOSIS — F32A Depression, unspecified: Secondary | ICD-10-CM

## 2022-04-07 DIAGNOSIS — F419 Anxiety disorder, unspecified: Secondary | ICD-10-CM

## 2022-04-07 MED ORDER — HYDROCHLOROTHIAZIDE 12.5 MG PO CAPS: 12.5000 mg | ORAL_CAPSULE | Freq: Every day | ORAL | 4 refills | Status: DC

## 2022-04-07 MED ORDER — BUPROPION HCL ER (XL) 150 MG PO TB24: 150.0000 mg | ORAL_TABLET | Freq: Every evening | ORAL | 2 refills | Status: DC

## 2022-04-07 MED ORDER — BUPROPION HCL ER (XL) 300 MG PO TB24: 300.0000 mg | ORAL_TABLET | Freq: Every morning | ORAL | 2 refills | Status: DC

## 2022-04-08 LAB — COMPREHENSIVE METABOLIC PANEL
ALT: 16 IU/L (ref 0–32)
AST: 18 IU/L (ref 0–40)
Albumin/Globulin Ratio: 1.5 (ref 1.2–2.2)
Albumin: 4.2 g/dL (ref 3.9–4.9)
Alkaline Phosphatase: 53 IU/L (ref 44–121)
BUN/Creatinine Ratio: 10 (ref 9–23)
BUN: 9 mg/dL (ref 6–24)
Bilirubin Total: 0.4 mg/dL (ref 0.0–1.2)
CO2: 23 mmol/L (ref 20–29)
Calcium: 9.4 mg/dL (ref 8.7–10.2)
Chloride: 106 mmol/L (ref 96–106)
Creatinine, Ser: 0.88 mg/dL (ref 0.57–1.00)
Globulin, Total: 2.8 g/dL (ref 1.5–4.5)
Glucose: 74 mg/dL (ref 70–99)
Potassium: 4.2 mmol/L (ref 3.5–5.2)
Sodium: 142 mmol/L (ref 134–144)
Total Protein: 7 g/dL (ref 6.0–8.5)
eGFR: 81 mL/min/{1.73_m2} (ref >59)

## 2022-04-08 LAB — LIPID PANEL
Chol/HDL Ratio: 4.6 ratio — ABNORMAL HIGH (ref 0.0–4.4)
Cholesterol, Total: 190 mg/dL (ref 100–199)
HDL: 41 mg/dL (ref >39)
LDL Chol Calc (NIH): 130 mg/dL — ABNORMAL HIGH (ref 0–99)
Triglycerides: 107 mg/dL (ref 0–149)
VLDL Cholesterol Cal: 19 mg/dL (ref 5–40)

## 2022-04-08 LAB — CBC
Hematocrit: 40 % (ref 34.0–46.6)
Hemoglobin: 13.2 g/dL (ref 11.1–15.9)
MCH: 31.7 pg (ref 26.6–33.0)
MCHC: 33 g/dL (ref 31.5–35.7)
MCV: 96 fL (ref 79–97)
Platelets: 329 10*3/uL (ref 150–450)
RBC: 4.17 x10E6/uL (ref 3.77–5.28)
RDW: 12.1 % (ref 11.7–15.4)
WBC: 7.8 10*3/uL (ref 3.4–10.8)

## 2022-06-10 ENCOUNTER — Encounter: Payer: Self-pay | Admitting: Obstetrics and Gynecology

## 2022-06-10 DIAGNOSIS — N939 Abnormal uterine and vaginal bleeding, unspecified: Secondary | ICD-10-CM

## 2022-06-28 ENCOUNTER — Ambulatory Visit: Payer: No Typology Code available for payment source

## 2022-07-30 ENCOUNTER — Ambulatory Visit
Admission: RE | Admit: 2022-07-30 | Discharge: 2022-07-30 | Disposition: A | Discharge status: Home / Self Care | Payer: Commercial Managed Care - PPO | Source: Ambulatory Visit | Attending: Obstetrics and Gynecology | Admitting: Obstetrics and Gynecology

## 2022-07-30 DIAGNOSIS — N939 Abnormal uterine and vaginal bleeding, unspecified: Secondary | ICD-10-CM | POA: Diagnosis present

## 2022-08-02 ENCOUNTER — Encounter: Payer: Self-pay | Admitting: Obstetrics and Gynecology

## 2022-10-27 ENCOUNTER — Encounter: Payer: Self-pay | Admitting: Family Medicine

## 2022-10-27 DIAGNOSIS — R519 Headache, unspecified: Secondary | ICD-10-CM

## 2022-10-27 DIAGNOSIS — I10 Essential (primary) hypertension: Secondary | ICD-10-CM

## 2022-11-03 MED ORDER — DILTIAZEM HCL ER COATED BEADS 120 MG PO CP24: 120.0000 mg | ORAL_CAPSULE | Freq: Every day | ORAL | 1 refills | Status: DC

## 2023-01-14 ENCOUNTER — Encounter: Payer: Self-pay | Admitting: Family Medicine

## 2023-01-14 ENCOUNTER — Ambulatory Visit (INDEPENDENT_AMBULATORY_CARE_PROVIDER_SITE_OTHER): Payer: Commercial Managed Care - PPO | Admitting: Family Medicine

## 2023-01-14 VITALS — BP 128/87 | HR 96 | Temp 97.8°F | Resp 12 | Ht 65.0 in | Wt 158.0 lb

## 2023-01-14 DIAGNOSIS — Z23 Encounter for immunization: Secondary | ICD-10-CM

## 2023-01-14 DIAGNOSIS — I10 Essential (primary) hypertension: Secondary | ICD-10-CM | POA: Diagnosis not present

## 2023-01-14 MED ORDER — VALSARTAN 80 MG PO TABS: 80.0000 mg | ORAL_TABLET | Freq: Every day | ORAL | 1 refills | Status: DC

## 2023-01-14 NOTE — Progress Notes (Signed)
Established patient visit   Patient: Lori Dawson   DOB: 1975-10-04   47 y.o. Female  MRN: [id number] Visit Date: 01/14/2023  Today's healthcare provider: Mila Merry, MD   Chief Complaint  Patient presents with   Medical Management of Chronic Issues   Subjective    Discussed the use of AI scribe software for clinical note transcription with the patient, who gave verbal consent to proceed.  History of Present Illness   The patient, with a history of hypertension and migraines, presents for a follow-up visit since starting diltiazem in June. She reports no improvement with headaches since starting diltiazem, experiencing them twice a week, maybe a little more often than prior to starting the medication. The migraines are described as centralized headaches without any precursor symptoms. The patient is unsure if the increase in migraines is due to the medication or the current high pollen count. She manages the migraines with Excedrin and occasionally Aleve, which usually alleviates the pain. She reports has had similar headaches for many years and previously migraine medications that just made her groggy.   The patient also reports a home blood pressure reading of 140-150/80-90, which is higher than the desired range. She has not noticed any significant change in her blood pressure since starting diltiazem and reports no side effects from the medication.  The patient has a long history of allergies and is currently taking Claritin in the morning and Zyrtec at night. She has completed five years of allergy shots and reports being allergic to almost everything, with December being the only month she is not allergic to something. She has not used Nasonex, an allergy nasal spray, since the spring.       Medications: Outpatient Medications Prior to Visit  Medication Sig   buPROPion (WELLBUTRIN XL) 150 MG 24 hr tablet Take 1 tablet (150 mg total) by mouth every evening.   buPROPion  (WELLBUTRIN XL) 300 MG 24 hr tablet Take 1 tablet (300 mg total) by mouth every morning.   cetirizine (ZYRTEC) 10 MG tablet Take 10 mg by mouth as needed.    hydrochlorothiazide (MICROZIDE) 12.5 MG capsule Take 1 capsule (12.5 mg total) by mouth daily.   loratadine (CLARITIN) 10 MG tablet Take 10 mg by mouth daily.   norethindrone (AYGESTIN) 5 MG tablet Take 1 tablet (5 mg total) by mouth daily.   ondansetron (ZOFRAN ODT) 4 MG disintegrating tablet Take 1 tablet (4 mg total) by mouth every 6 (six) hours as needed for nausea.   [DISCONTINUED] diltiazem (CARDIZEM CD) 120 MG 24 hr capsule Take 1 capsule (120 mg total) by mouth daily.   No facility-administered medications prior to visit.   Review of Systems     Objective    BP 128/87 (BP Location: Left Arm, Patient Position: Sitting, Cuff Size: Large)   Pulse 96   Temp 97.8 F (36.6 C) (Temporal)   Resp 12   Ht 5\' 5"  (1.651 m)   Wt 158 lb (71.7 kg)   SpO2 99%   BMI 26.29 kg/m   Physical Exam   General: Appearance:    Well developed, well nourished female in no acute distress  Eyes:    PERRL, conjunctiva/corneas clear, EOM's intact       Lungs:     Clear to auscultation bilaterally, respirations unlabored  Heart:    Normal heart rate. Normal rhythm. No murmurs, rubs, or gallops.    MS:   All extremities are intact.    Neurologic:  Awake, alert, oriented x 3. No apparent focal neurological defect.         Assessment & Plan        Headaches, migraine versus tension versus sinus No change in frequency or character of headaches despite initiation of Diltiazem. Possible exacerbation due to seasonal allergies. -Discontinue Diltiazem due to lack of efficacy. -Continue Excedrin as needed for acute episodes. -Consider other preventative medications if no improvement after better blood pressure control.  Hypertension Blood pressure readings at home consistently above target (140-150/80-90). No improvement with  Diltiazem. -Discontinue Diltiazem. -Start Valsartan 80, monitor for side effects including fatigue and cough. -Check blood pressure and potassium levels in 3-4 weeks.  Seasonal Allergies Previously treated with allergy shots, now on OTC Claritin and Zyrtec. Possible contribution to increased frequency of migraines. -Recommend resuming use of Nasonex.  General Health Maintenance -Administer influenza vaccine today.       Mila Merry, MD  [facility] [phone] (phone) [phone] (fax)  [facility]

## 2023-01-21 ENCOUNTER — Ambulatory Visit: Payer: PRIVATE HEALTH INSURANCE | Admitting: Family Medicine

## 2023-02-04 ENCOUNTER — Ambulatory Visit (INDEPENDENT_AMBULATORY_CARE_PROVIDER_SITE_OTHER): Payer: Commercial Managed Care - PPO | Admitting: Family Medicine

## 2023-02-04 VITALS — BP 122/87 | HR 90 | Temp 99.2°F | Ht 67.0 in | Wt 156.0 lb

## 2023-02-04 DIAGNOSIS — R519 Headache, unspecified: Secondary | ICD-10-CM

## 2023-02-04 DIAGNOSIS — Z1211 Encounter for screening for malignant neoplasm of colon: Secondary | ICD-10-CM | POA: Diagnosis not present

## 2023-02-04 DIAGNOSIS — J3089 Other allergic rhinitis: Secondary | ICD-10-CM | POA: Diagnosis not present

## 2023-02-04 DIAGNOSIS — F419 Anxiety disorder, unspecified: Secondary | ICD-10-CM | POA: Diagnosis not present

## 2023-02-04 DIAGNOSIS — F32A Depression, unspecified: Secondary | ICD-10-CM

## 2023-02-04 DIAGNOSIS — I10 Essential (primary) hypertension: Secondary | ICD-10-CM

## 2023-02-04 MED ORDER — BUPROPION HCL ER (XL) 150 MG PO TB24
150.0000 mg | ORAL_TABLET | Freq: Every evening | ORAL | 3 refills | Status: DC
Start: 2023-02-04 — End: 2024-02-22

## 2023-02-04 MED ORDER — BUPROPION HCL ER (XL) 300 MG PO TB24
300.0000 mg | ORAL_TABLET | Freq: Every morning | ORAL | 3 refills | Status: DC
Start: 2023-02-04 — End: 2024-02-23

## 2023-02-04 NOTE — Progress Notes (Signed)
Established patient visit   Patient: Lori Dawson   DOB: 1975/10/13   47 y.o. Female  MRN: [id number] Visit Date: 02/04/2023  Today's healthcare provider: Mila Merry, MD   No chief complaint on file.  Subjective    Discussed the use of AI scribe software for clinical note transcription with the patient, who gave verbal consent to proceed.  History of Present Illness   The patient, with a history of hypertension and frequent headaches, reports a significant improvement in both conditions. She notes that her blood pressure medication seems to be effective, with no reported side effects. The frequency of headaches has also decreased from one to two per week to only two or three severe episodes recently. The patient is unsure if the reduction in headaches is due to the blood pressure medication but acknowledges a possible connection.  In addition to hypertension, the patient has a history of allergies, particularly to environmental factors in [place]. She manages these allergies with daily Zyrtec and Claritin, and has completed seven years of allergy shots. She also takes Tylenol cold and sinus as needed for congestion. The patient reports a relatively good fall season in terms of allergy symptoms.  The patient is also on Wellbutrin 300 in the in the morning and a 150mg  dose in the evening, and feels this regiment is working very well and is well tolerated.   She is due for colon cancer screening, having completed a Cologuard test three years prior.       Medications: Outpatient Medications Prior to Visit  Medication Sig   cetirizine (ZYRTEC) 10 MG tablet Take 10 mg by mouth as needed.    loratadine (CLARITIN) 10 MG tablet Take 10 mg by mouth daily.   norethindrone (AYGESTIN) 5 MG tablet Take 1 tablet (5 mg total) by mouth daily.   ondansetron (ZOFRAN ODT) 4 MG disintegrating tablet Take 1 tablet (4 mg total) by mouth every 6 (six) hours as needed for nausea.   valsartan  (DIOVAN) 80 MG tablet Take 1 tablet (80 mg total) by mouth daily.   [DISCONTINUED] buPROPion (WELLBUTRIN XL) 150 MG 24 hr tablet Take 1 tablet (150 mg total) by mouth every evening.   [DISCONTINUED] buPROPion (WELLBUTRIN XL) 300 MG 24 hr tablet Take 1 tablet (300 mg total) by mouth every morning.   [DISCONTINUED] hydrochlorothiazide (MICROZIDE) 12.5 MG capsule Take 1 capsule (12.5 mg total) by mouth daily.   No facility-administered medications prior to visit.      Objective    BP 122/87 (BP Location: Right Arm, Patient Position: Sitting, Cuff Size: Normal)   Pulse 90   Temp 99.2 F (37.3 C) (Oral)   Ht 5\' 7"  (1.702 m)   Wt 156 lb (70.8 kg)   SpO2 100%   BMI 24.43 kg/m   Physical Exam  General appearance: Well developed, well nourished female, cooperative and in no acute distress Head: Normocephalic, without obvious abnormality, atraumatic Respiratory: Respirations even and unlabored, normal respiratory rate Extremities: All extremities are intact.  Skin: Skin color, texture, turgor normal. No rashes seen  Psych: Appropriate mood and affect. Neurologic: Mental status: Alert, oriented to person, place, and time, thought content appropriate.   Assessment & Plan        Hypertension Well controlled on Valsartan with no reported side effects. Noted potential for Valsartan to affect electrolyte balance, particularly potassium levels. -Continue Valsartan. -Order labs to check electrolyte levels, including potassium.  Headaches Reduced frequency, possibly related to improved blood pressure  control. -Continue current treatment plan.  Depression/anxeity Stable on Wellbutrin 300mg  in the morning and 150mg  in the evening. -Renew Wellbutrin prescription.  Allergies Managed with daily Zyrtec and Claritin, and occasional Tylenol cold and sinus as needed. -Continue current regimen.  Colon Cancer Screening Due for repeat Cologuard test (last done 3 years ago). -Order Cologuard test.     No follow-ups on file.      Mila Merry, MD  [facility] [phone] (phone) [phone] (fax)  [facility]

## 2023-02-05 LAB — CBC
Hematocrit: 43.7 % (ref 34.0–46.6)
Hemoglobin: 14.4 g/dL (ref 11.1–15.9)
MCH: 32.4 pg (ref 26.6–33.0)
MCHC: 33 g/dL (ref 31.5–35.7)
MCV: 98 fL — ABNORMAL HIGH (ref 79–97)
Platelets: 303 10*3/uL (ref 150–450)
RBC: 4.45 x10E6/uL (ref 3.77–5.28)
RDW: 11.4 % — ABNORMAL LOW (ref 11.7–15.4)
WBC: 6 10*3/uL (ref 3.4–10.8)

## 2023-02-05 LAB — LIPID PANEL
Chol/HDL Ratio: 4.9 ratio — ABNORMAL HIGH (ref 0.0–4.4)
Cholesterol, Total: 167 mg/dL (ref 100–199)
HDL: 34 mg/dL — ABNORMAL LOW (ref >39)
LDL Chol Calc (NIH): 119 mg/dL — ABNORMAL HIGH (ref 0–99)
Triglycerides: 70 mg/dL (ref 0–149)
VLDL Cholesterol Cal: 14 mg/dL (ref 5–40)

## 2023-02-05 LAB — COMPREHENSIVE METABOLIC PANEL
ALT: 27 IU/L (ref 0–32)
AST: 22 IU/L (ref 0–40)
Albumin: 4.5 g/dL (ref 3.9–4.9)
Alkaline Phosphatase: 62 IU/L (ref 44–121)
BUN/Creatinine Ratio: 8 — ABNORMAL LOW (ref 9–23)
BUN: 8 mg/dL (ref 6–24)
Bilirubin Total: 0.6 mg/dL (ref 0.0–1.2)
CO2: 22 mmol/L (ref 20–29)
Calcium: 9.4 mg/dL (ref 8.7–10.2)
Chloride: 105 mmol/L (ref 96–106)
Creatinine, Ser: 1.03 mg/dL — ABNORMAL HIGH (ref 0.57–1.00)
Globulin, Total: 2.7 g/dL (ref 1.5–4.5)
Glucose: 80 mg/dL (ref 70–99)
Potassium: 4.3 mmol/L (ref 3.5–5.2)
Sodium: 141 mmol/L (ref 134–144)
Total Protein: 7.2 g/dL (ref 6.0–8.5)
eGFR: 67 mL/min/{1.73_m2} (ref >59)

## 2023-02-20 LAB — COLOGUARD: COLOGUARD: NEGATIVE

## 2023-03-04 ENCOUNTER — Other Ambulatory Visit: Payer: Self-pay | Admitting: Obstetrics and Gynecology

## 2023-03-04 DIAGNOSIS — Z3041 Encounter for surveillance of contraceptive pills: Secondary | ICD-10-CM

## 2023-03-10 ENCOUNTER — Other Ambulatory Visit: Payer: Self-pay

## 2023-03-10 DIAGNOSIS — Z3041 Encounter for surveillance of contraceptive pills: Secondary | ICD-10-CM

## 2023-03-10 MED ORDER — NORETHINDRONE ACETATE 5 MG PO TABS: 5.0000 mg | ORAL_TABLET | Freq: Every day | ORAL | 0 refills | Status: DC

## 2023-03-11 NOTE — Telephone Encounter (Signed)
This encounter was created in error - please disregard.

## 2023-03-13 ENCOUNTER — Other Ambulatory Visit: Payer: Self-pay | Admitting: Family Medicine

## 2023-03-13 DIAGNOSIS — I10 Essential (primary) hypertension: Secondary | ICD-10-CM

## 2023-03-15 ENCOUNTER — Encounter: Payer: Self-pay | Admitting: Family Medicine

## 2023-03-15 ENCOUNTER — Other Ambulatory Visit: Payer: Self-pay

## 2023-03-15 DIAGNOSIS — I10 Essential (primary) hypertension: Secondary | ICD-10-CM

## 2023-03-15 MED ORDER — VALSARTAN 80 MG PO TABS: 80.0000 mg | ORAL_TABLET | Freq: Every day | ORAL | 1 refills | Status: DC

## 2023-03-22 NOTE — Progress Notes (Unsigned)
No chief complaint on file.    HPI:      [name] is a 47 y.o. G2P0 who LMP was No LMP recorded. (Menstrual status: Oral contraceptives)., presents today for her annual examination. Her menses are irregular on slynd. Used to be Q3 months with slynd continuous dosing, lasting 5-7 days, 1 heavy day, no BTB. Now has bleeding for 2 wks, heavy for 4-5 days, then normal for remainder of 2 wks. Pt stops POPs for a wk once bleeding starts, then restart pills. Then has daily spotting/light bleeding for another wk. Then sx start over. Tried aygestin last yr for bleeding control with sx relief. On POPs for menometrorrhagia, used be on OCPs but changed 5/21 due to HTN dx. Had neg DUB eval 2019 with labs and u/s. No recent thyroid labs. Pt with vasomotor sx and wt gain now. Very upset about constant bleeding. Affecting relationship with husband and that is adding to increased stress/anxiety.   Sex activity: single partner, contraception - tubal ligation. Has dryness with pain, water-based lubricants not lasting long enough. Starts bleeding day after sex as well. Last Pap: 12/30/20 Results were: no abnormalities /neg HPV DNA  Hx of STDs: none   Last mammogram: 01/22/21 Results were: normal; repeat in 12 months.  There is no FH of breast cancer. There is no FH of ovarian cancer. The patient does occas do self-breast exams.   Tobacco use: The patient denies current or previous tobacco use. Alcohol use: social drinker No drug use Exercise: moderately active   She does get adequate calcium and Vitamin D in her diet.  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  Doing better with it.      Past Medical History:  Diagnosis Date   Abnormal mammogram 11/10/2016   Allergy    seasonal   Atypical chest pain 09/22/2017   Degenerative disc disease at L5-S1 level    Environmental and  seasonal allergies 09/01/2019   Herniated lumbar intervertebral disc    HTN (hypertension) 08/09/2019   Low back pain 08/09/2019   Mass of lower inner quadrant of left breast 11/17/2016   Migraine headache    Overweight (BMI 25.0-29.9) 08/09/2019   Palpitations 09/22/2017   Screening for colon cancer 10/2019   neg Cologuard; repeat due in 3 yrs    Past Surgical History:  Procedure Laterality Date   AUGMENTATION MAMMAPLASTY  2004   in [place], right breast was larger   BREAST BIOPSY Left 11/18/2016   core bx Byrnett, neg   TUBAL LIGATION  2006    Family History  Problem Relation Age of Onset   Cervical cancer Maternal Aunt        30s/40s   Diabetes Paternal Grandmother    Lung cancer Paternal Grandmother    Lung cancer Paternal Grandfather    Thyroid disease Mother    Arthritis Mother    Hypertension Father    Hyperlipidemia Father    Arthritis Father    Heart attack Brother 33   Coronary artery disease Brother    COPD Brother    Coronary artery disease Maternal Grandfather    Anxiety disorder Daughter    Healthy Daughter    Anxiety disorder Daughter    Healthy Daughter    Breast cancer Neg Hx     Social History   Socioeconomic History   Marital status:  Married    Spouse name: Not on file   Number of children: Not on file   Years of education: Not on file   Highest education level: Bachelor's degree (e.g., BA, AB, BS)  Occupational History   Not on file  Tobacco Use   Smoking status: Never   Smokeless tobacco: Never  Vaping Use   Vaping status: Never Used  Substance and Sexual Activity   Alcohol use: Yes    Alcohol/week: 3.0 standard drinks of alcohol    Types: 3 Glasses of wine per week   Drug use: Never   Sexual activity: Yes    Birth control/protection: Surgical, Pill    Comment: Tubal ligation   Other Topics Concern   Not on file  Social History Narrative    Married 22 years. 2 girls- college and DTR in grad school. 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   Social Determinants of Health   Financial Resource Strain: Patient Declined (01/12/2023)   Overall Financial Resource Strain (CARDIA)    Difficulty of Paying Living Expenses: Patient declined  Food Insecurity: Patient Declined (01/12/2023)   Hunger Vital Sign    Worried About Running Out of Food in the Last Year: Patient declined    Ran Out of Food in the Last Year: Patient declined  Transportation Needs: Patient Declined (01/12/2023)   PRAPARE - Administrator, Civil Service (Medical): Patient declined    Lack of Transportation (Non-Medical): Patient declined  Physical Activity: Unknown (01/12/2023)   Exercise Vital Sign    Days of Exercise per Week: Patient declined    Minutes of Exercise per Session: Not on file  Stress: Patient Declined (01/12/2023)   [employer] of Occupational Health - Occupational Stress Questionnaire    Feeling of Stress : Patient declined  Social Connections: Unknown (01/12/2023)   Social Connection and Isolation Panel [NHANES]    Frequency of Communication with Friends and Family: Patient declined    Frequency of Social Gatherings with Friends and Family: Patient declined    Attends Religious Services: Patient declined    Database administrator or Organizations: Patient declined    Attends Engineer, structural: Not on file    Marital Status: Patient declined  Intimate Partner Violence: Not on file    Current Outpatient Medications on File Prior to Visit  Medication Sig Dispense Refill   buPROPion (WELLBUTRIN XL) 150 MG 24 hr tablet Take 1 tablet (150 mg total) by mouth every evening. 90 tablet 3   buPROPion (WELLBUTRIN XL) 300 MG 24 hr tablet Take 1 tablet (300 mg total) by mouth every morning. 90 tablet 3   cetirizine (ZYRTEC) 10 MG tablet Take 10 mg by mouth as needed.      loratadine (CLARITIN) 10 MG tablet Take 10 mg by mouth daily.     norethindrone (AYGESTIN) 5 MG tablet Take 1 tablet (5 mg total) by mouth  daily. 90 tablet 0   ondansetron (ZOFRAN ODT) 4 MG disintegrating tablet Take 1 tablet (4 mg total) by mouth every 6 (six) hours as needed for nausea. 30 tablet 0   valsartan (DIOVAN) 80 MG tablet Take 1 tablet (80 mg total) by mouth daily. 30 tablet 1   No current facility-administered medications on file prior to visit.      ROS:  Review of Systems  Constitutional:  Negative for fatigue, fever and unexpected weight change.  Respiratory:  Negative for cough, shortness of breath and wheezing.   Cardiovascular:  Negative for chest pain, palpitations and leg swelling.  Gastrointestinal:  Negative for blood in stool, constipation, diarrhea, nausea and vomiting.  Endocrine: Negative for cold intolerance, heat intolerance and polyuria.  Genitourinary:  Positive for dyspareunia and vaginal bleeding. Negative for dysuria, flank pain, frequency, genital sores, hematuria, menstrual problem, pelvic pain, urgency, vaginal discharge and vaginal pain.  Musculoskeletal:  Negative for arthralgias, back pain, joint swelling and myalgias.  Skin:  Negative for rash.  Neurological:  Negative for dizziness, syncope, light-headedness, numbness and headaches.  Hematological:  Negative for adenopathy.  Psychiatric/Behavioral:  Positive for agitation and dysphoric mood. Negative for confusion, sleep disturbance and suicidal ideas. The patient is not nervous/anxious.      Objective: There were no vitals taken for this visit.   Physical Exam Constitutional:      Appearance: She is well-developed.  Genitourinary:     Vulva normal.     Right Labia: No rash, tenderness or lesions.    Left Labia: No tenderness, lesions or rash.    No vaginal discharge, erythema or tenderness.      Right Adnexa: not tender and no mass present.    Left Adnexa: not tender and no mass present.    No cervical motion tenderness, friability or polyp.     Uterus is not enlarged or tender.  Breasts:    Right: No mass, nipple  discharge, skin change or tenderness.     Left: No mass, nipple discharge, skin change or tenderness.  Neck:     Thyroid: No thyromegaly.  Cardiovascular:     Rate and Rhythm: Normal rate and regular rhythm.     Heart sounds: Normal heart sounds. No murmur heard. Pulmonary:     Effort: Pulmonary effort is normal.     Breath sounds: Normal breath sounds.  Abdominal:     Palpations: Abdomen is soft.     Tenderness: There is no abdominal tenderness. There is no guarding or rebound.  Musculoskeletal:        General: Normal range of motion.     Cervical back: Normal range of motion.  Lymphadenopathy:     Cervical: No cervical adenopathy.  Neurological:     General: No focal deficit present.     Mental Status: She is alert and oriented to person, place, and time.     Cranial Nerves: No cranial nerve deficit.  Skin:    General: Skin is warm and dry.  Psychiatric:        Mood and Affect: Mood normal.        Behavior: Behavior normal.        Thought Content: Thought content normal.        Judgment: Judgment normal.  Vitals reviewed.     Assessment/Plan: Encounter for annual routine gynecological examination  Encounter for surveillance of contraceptive pills - Plan: norethindrone (AYGESTIN) 5 MG tablet; change to aygestin due to BTB. Rx eRxd. F/u prn.   Encounter for screening mammogram for malignant neoplasm of breast - Plan: MM 3D SCREEN BREAST BILATERAL; pt to schedule mammo  Breakthrough bleeding on OCPs - Plan: TSH, T4, free; check labs, change to aygestin. If sx persist, will check GYN u/s.   Thyroid disorder screening - Plan: TSH, T4, free  Menorrhagia with regular cycle -was controlled with Slynd; will change to aygestin. Also discussed IUD, pt wants to stay on POPs for now.    No orders of the defined types were placed in this encounter.        GYN  counsel breast self exam, mammography screening, adequate intake of calcium and vitamin D, diet and exercise      F/U  No follow-ups on file.  Lori Bieri B. Malachy Coleman, PA-C 03/22/2023 4:47 PM

## 2023-03-24 ENCOUNTER — Encounter: Payer: Self-pay | Admitting: Obstetrics and Gynecology

## 2023-03-24 ENCOUNTER — Ambulatory Visit: Payer: Commercial Managed Care - PPO | Admitting: Obstetrics and Gynecology

## 2023-03-24 VITALS — BP 128/77 | HR 88 | Ht 67.0 in | Wt 155.0 lb

## 2023-03-24 DIAGNOSIS — Z01419 Encounter for gynecological examination (general) (routine) without abnormal findings: Secondary | ICD-10-CM

## 2023-03-24 DIAGNOSIS — Z1231 Encounter for screening mammogram for malignant neoplasm of breast: Secondary | ICD-10-CM

## 2023-03-24 DIAGNOSIS — Z1211 Encounter for screening for malignant neoplasm of colon: Secondary | ICD-10-CM

## 2023-03-24 DIAGNOSIS — Z3041 Encounter for surveillance of contraceptive pills: Secondary | ICD-10-CM

## 2023-03-24 MED ORDER — NORETHINDRONE ACETATE 5 MG PO TABS: 5.0000 mg | ORAL_TABLET | Freq: Every day | ORAL | 3 refills | Status: DC

## 2023-03-24 NOTE — Patient Instructions (Signed)
I value your feedback and you entrusting us with your care. If you get a [facility] patient survey, I would appreciate you taking the time to let us know about your experience today. Thank you!  [facility] ([place])--[phone]  

## 2023-05-16 ENCOUNTER — Other Ambulatory Visit: Payer: Self-pay | Admitting: Family Medicine

## 2023-05-16 DIAGNOSIS — I10 Essential (primary) hypertension: Secondary | ICD-10-CM

## 2023-06-03 ENCOUNTER — Encounter: Payer: Self-pay | Admitting: Family Medicine

## 2023-06-03 DIAGNOSIS — I10 Essential (primary) hypertension: Secondary | ICD-10-CM

## 2023-06-06 MED ORDER — HYDROCHLOROTHIAZIDE 12.5 MG PO TABS: 12.5000 mg | ORAL_TABLET | Freq: Every day | ORAL | 1 refills | Status: DC

## 2023-07-12 ENCOUNTER — Other Ambulatory Visit: Payer: Self-pay | Admitting: Family Medicine

## 2023-07-12 DIAGNOSIS — I10 Essential (primary) hypertension: Secondary | ICD-10-CM

## 2023-08-08 ENCOUNTER — Other Ambulatory Visit: Payer: Self-pay | Admitting: Family Medicine

## 2023-08-08 DIAGNOSIS — I10 Essential (primary) hypertension: Secondary | ICD-10-CM

## 2023-08-24 ENCOUNTER — Ambulatory Visit
Admission: RE | Admit: 2023-08-24 | Discharge: 2023-08-24 | Disposition: A | Discharge status: Home / Self Care | Source: Ambulatory Visit | Attending: Obstetrics and Gynecology | Admitting: Obstetrics and Gynecology

## 2023-08-24 DIAGNOSIS — Z1231 Encounter for screening mammogram for malignant neoplasm of breast: Secondary | ICD-10-CM | POA: Insufficient documentation

## 2023-08-29 ENCOUNTER — Encounter: Payer: Self-pay | Admitting: Obstetrics and Gynecology

## 2023-12-10 ENCOUNTER — Other Ambulatory Visit: Payer: Self-pay | Admitting: Family Medicine

## 2023-12-10 DIAGNOSIS — I10 Essential (primary) hypertension: Secondary | ICD-10-CM

## 2024-01-16 ENCOUNTER — Other Ambulatory Visit: Payer: Self-pay | Admitting: Family Medicine

## 2024-01-16 DIAGNOSIS — I10 Essential (primary) hypertension: Secondary | ICD-10-CM

## 2024-02-14 ENCOUNTER — Other Ambulatory Visit: Payer: Self-pay | Admitting: Family Medicine

## 2024-02-14 DIAGNOSIS — I10 Essential (primary) hypertension: Secondary | ICD-10-CM

## 2024-02-22 ENCOUNTER — Other Ambulatory Visit: Payer: Self-pay | Admitting: Family Medicine

## 2024-02-22 DIAGNOSIS — F32A Depression, unspecified: Secondary | ICD-10-CM

## 2024-02-23 ENCOUNTER — Other Ambulatory Visit: Payer: Self-pay | Admitting: Family Medicine

## 2024-02-23 DIAGNOSIS — F419 Anxiety disorder, unspecified: Secondary | ICD-10-CM

## 2024-03-18 ENCOUNTER — Telehealth: Payer: Self-pay | Admitting: Family Medicine

## 2024-03-18 DIAGNOSIS — I10 Essential (primary) hypertension: Secondary | ICD-10-CM

## 2024-03-19 NOTE — Telephone Encounter (Signed)
 Patient has not been seen since 01/2023. Have sent 30 day supply of valsartan , but needs to schedule appointment before any refills can be approved.

## 2024-04-11 ENCOUNTER — Ambulatory Visit (INDEPENDENT_AMBULATORY_CARE_PROVIDER_SITE_OTHER): Admitting: Family Medicine

## 2024-04-11 ENCOUNTER — Encounter: Payer: Self-pay | Admitting: Family Medicine

## 2024-04-11 VITALS — BP 116/81 | HR 82 | Ht 67.0 in | Wt 157.4 lb

## 2024-04-11 DIAGNOSIS — F32A Depression, unspecified: Secondary | ICD-10-CM

## 2024-04-11 DIAGNOSIS — Z23 Encounter for immunization: Secondary | ICD-10-CM

## 2024-04-11 DIAGNOSIS — I10 Essential (primary) hypertension: Secondary | ICD-10-CM

## 2024-04-11 DIAGNOSIS — Z8249 Family history of ischemic heart disease and other diseases of the circulatory system: Secondary | ICD-10-CM

## 2024-04-11 DIAGNOSIS — R12 Heartburn: Secondary | ICD-10-CM

## 2024-04-11 MED ORDER — VALSARTAN-HYDROCHLOROTHIAZIDE 80-12.5 MG PO TABS: 1.0000 | ORAL_TABLET | Freq: Every day | ORAL | 3 refills | Status: AC

## 2024-04-11 NOTE — Progress Notes (Signed)
 Established patient visit   Patient: Lori Dawson   DOB: 1975/12/14   48 y.o. Female  MRN: [id number] Visit Date: 04/11/2024  Today's healthcare provider: Nancyann Perry, MD   Chief Complaint  Patient presents with   Medical Management of Chronic Issues    HTN follow up   Subjective    Discussed the use of AI scribe software for clinical note transcription with the patient, who gave verbal consent to proceed.  History of Present Illness   Lori Dawson is a 48 year old female with hypertension and anxiety who presents for medication management.  Her blood pressure is well-controlled with her current regimen of valsartan  and hctz. Since reintroducing the hctz her blood pressure readings have been approximately 110s/70s.  She is interested in a combination pill to simplify her regimen, as one of the pills is very small and difficult to handle.  She is currently taking Wellbutrin  for anxiety, with a dosage of 300 mg in the morning and 150 mg in the evening. She describes the medication as 'wonderful' and is concerned about discontinuing it due to fear of increased anxiety. Her anxiety is present but not as severe as before starting the medication. No trouble sleeping or resting.  She experiences indigestion but finds relief with daily Pepcid, which she describes as 'amazing' for her symptoms. She inquires about the safety of long-term daily use of Pepcid.  She has a family history of heart problems, with her brother having had a heart attack at 53 and her mother having thyroid problems. She lives with a smoker but states that he does not smoke around her. No smoking.     Lab Results  Component Value Date   CHOL 167 02/04/2023   HDL 34 (L) 02/04/2023   LDLCALC 119 (H) 02/04/2023   TRIG 70 02/04/2023   CHOLHDL 4.9 (H) 02/04/2023     Medications: Outpatient Medications Prior to Visit  Medication Sig   buPROPion  (WELLBUTRIN  XL) 150 MG 24 hr tablet TAKE 1 TABLET BY MOUTH  EVERY EVENING.   buPROPion  (WELLBUTRIN  XL) 300 MG 24 hr tablet TAKE 1 TABLET (300 MG TOTAL) BY MOUTH EVERY MORNING.   cetirizine (ZYRTEC) 10 MG tablet Take 10 mg by mouth as needed.    Famotidine (PEPCID PO) Take 1 tablet by mouth every morning.   fluticasone (FLONASE) 50 MCG/ACT nasal spray SMARTSIG:2 Spray(s) Both Nares Every 12 Hours   loratadine (CLARITIN) 10 MG tablet Take 10 mg by mouth daily.   norethindrone  (AYGESTIN ) 5 MG tablet Take 1 tablet (5 mg total) by mouth daily.   hydrochlorothiazide  (HYDRODIURIL ) 12.5 MG tablet TAKE 1 TABLET (12.5 MG TOTAL) BY MOUTH DAILY. TAKE IN ADDITION TO VALSARTAN  EVERY DAY   valsartan  (DIOVAN ) 80 MG tablet TAKE 1 TABLET BY MOUTH EVERY DAY   No facility-administered medications prior to visit.   Review of Systems  Constitutional:  Negative for appetite change, chills, fatigue and fever.  Respiratory:  Negative for chest tightness and shortness of breath.   Cardiovascular:  Negative for chest pain and palpitations.  Gastrointestinal:  Negative for abdominal pain, nausea and vomiting. Anal bleeding: burtn. Neurological:  Negative for dizziness and weakness.       Objective    BP 116/81 (BP Location: Right Arm, Patient Position: Sitting, Cuff Size: Normal)   Pulse 82   Ht 5' 7 (1.702 m)   Wt 157 lb 6.4 oz (71.4 kg)   SpO2 100%   BMI 24.65 kg/m  Physical Exam   General: Appearance:    Well developed, well nourished female in no acute distress  Eyes:    PERRL, conjunctiva/corneas clear, EOM's intact       Lungs:     Clear to auscultation bilaterally, respirations unlabored  Heart:    Normal heart rate. Normal rhythm. No murmurs, rubs, or gallops.    MS:   All extremities are intact.    Neurologic:   Awake, alert, oriented x 3. No apparent focal neurological defect.        Assessment & Plan      1. Primary hypertension (Primary) Very well controlled on current medications. Continue current dose of valsartan  and hydrochlorothiazide  but  will change to combination tablet.   - CBC - Comprehensive metabolic panel with GFR - Lipid panel - Lipoprotein A (LPA) - Apolipoprotein B  2. Immunization due  - Flu vaccine trivalent PF, 6mos and older(Flulaval,Afluria,Fluarix,Fluzone) - Heplisav-B  (HepB-CPG) Vaccine +1  She declined prevnar and Shingrix for the time being.   3. Family history of myocardial infarction in first degree female relative  - Lipid panel - Lipoprotein A (LPA) - Apolipoprotein B  4. Anxiety and depression Doing very well on current bupropion  regiment which she wishes to continue unchanged.       Nancyann Perry, MD  [facility] [phone] (phone) [phone] (fax)  [facility]

## 2024-04-11 NOTE — Patient Instructions (Signed)
 Please review the attached list of medications and notify my office if there are any errors.   I recommend that you get the Prevnar 20 vaccine to protect yourself from certain dangerous strains of pneumonia. You can get Prevnar 20 at your pharmacy, or call our office at [phone] at your earliest convenience to schedule this vaccine.   I strongly recommend two doses of Shingrix (the shingles vaccine) separated by 2 to 6 months for adults age 50 years and older. I recommend checking with your insurance plan regarding coverage for this vaccine.

## 2024-04-17 NOTE — Progress Notes (Unsigned)
 No chief complaint on file.    HPI:      [name] is a 48 y.o. G2P0 who LMP was No LMP recorded. (Menstrual status: Oral contraceptives)., presents today for her annual examination. Her menses are absent on aygestin  (hx of menometrorrhagia off BC). No BTB, occas mild dysmen. Was having AUB on slynd . Hx of HTN. Has tolerable VS sx.   Sex activity: single partner, contraception - tubal ligation. Has dryness improved with lubricants. Last Pap: 12/30/20 Results were: no abnormalities /neg HPV DNA  Hx of STDs: none   Last mammogram: 08/24/23 Results were: normal; repeat in 12 months.  There is no FH of breast cancer. There is no FH of ovarian cancer. The patient does occas do self-breast exams.   Tobacco use: The patient denies current or previous tobacco use. Alcohol use: social drinker No drug use Exercise: moderately active   She does get adequate calcium and Vitamin D in her diet.  Colonoscopy: neg Cologuard 05-14-2020 and 10/24; repeat after 3 yrs    Past Medical History:  Diagnosis Date   Abnormal mammogram 11/10/2016   Allergy    seasonal   Anxiety 2017-08-31   sudden death of brother   Arthritis 05-14-2020   Atypical chest pain 09/22/2017   Degenerative disc disease at L5-S1 level    Depression 08-31-17   sudden death of brother   Environmental and seasonal allergies 09/01/2019   GERD (gastroesophageal reflux disease)    Herniated lumbar intervertebral disc    HTN (hypertension) 09-01-2019   Low back pain 09/01/19   Mass of lower inner quadrant of left breast 11/17/2016   Migraine headache    Overweight (BMI 25.0-29.9) 09/01/2019   Palpitations 09/22/2017   Screening for colon cancer 10/2019   neg Cologuard; repeat due in 3 yrs    Past Surgical History:  Procedure Laterality Date   AUGMENTATION MAMMAPLASTY  15-May-2003   in [place], right breast was larger   BREAST BIOPSY Left 11/18/2016   core bx Byrnett, neg   TUBAL LIGATION  05/14/05    Family History  Problem  Relation Age of Onset   Cervical cancer Maternal Aunt        30s/40s   Diabetes Paternal Grandmother    Lung cancer Paternal Grandmother    Lung cancer Paternal Grandfather    Cancer Paternal Grandfather    Heart disease Paternal Grandfather    Thyroid disease Mother    Arthritis Mother    Varicose Veins Mother    Hypertension Father    Hyperlipidemia Father    Arthritis Father    Hearing loss Father    Heart attack Brother 4   Coronary artery disease Brother    COPD Brother    Early death Brother    Coronary artery disease Maternal Grandfather    Diabetes Maternal Grandfather    Anxiety disorder Daughter    Anxiety disorder Daughter    Breast cancer Neg Hx     Social History   Socioeconomic History   Marital status: Married    Spouse name: Not on file   Number of children: Not on file   Years of education: Not on file   Highest education level: Bachelor's degree (e.g., BA, AB, BS)  Occupational History   Not on file  Tobacco Use   Smoking status: Never   Smokeless tobacco: Never  Vaping Use   Vaping status: Never Used  Substance and Sexual Activity   Alcohol use: Yes  Alcohol/week: 3.0 standard drinks of alcohol    Types: 3 Glasses of wine per week    Comment: soc   Drug use: Never   Sexual activity: Yes    Birth control/protection: Surgical    Comment: Tubal ligation   Other Topics Concern   Not on file  Social History Narrative    Married 22 years. 2 girls- college and DTR in grad school. 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   Social Drivers of Health   Financial Resource Strain: Patient Declined (01/12/2023)   Overall Financial Resource Strain (CARDIA)    Difficulty of Paying Living Expenses: Patient declined  Food Insecurity: Patient Declined (01/12/2023)   Hunger Vital Sign    Worried About Running Out of Food in the Last Year: Patient declined    Ran Out of Food in the Last Year: Patient declined  Transportation Needs: Patient  Declined (01/12/2023)   PRAPARE - Administrator, Civil Service (Medical): Patient declined    Lack of Transportation (Non-Medical): Patient declined  Physical Activity: Unknown (01/12/2023)   Exercise Vital Sign    Days of Exercise per Week: Patient declined    Minutes of Exercise per Session: Not on file  Stress: Patient Declined (01/12/2023)   [employer] of Occupational Health - Occupational Stress Questionnaire    Feeling of Stress : Patient declined  Social Connections: Unknown (01/12/2023)   Social Connection and Isolation Panel    Frequency of Communication with Friends and Family: Patient declined    Frequency of Social Gatherings with Friends and Family: Patient declined    Attends Religious Services: Patient declined    Database Administrator or Organizations: Patient declined    Attends Engineer, Structural: Not on file    Marital Status: Patient declined  Intimate Partner Violence: Not on file    Current Outpatient Medications on File Prior to Visit  Medication Sig Dispense Refill   buPROPion  (WELLBUTRIN  XL) 150 MG 24 hr tablet TAKE 1 TABLET BY MOUTH EVERY EVENING. 90 tablet 3   buPROPion  (WELLBUTRIN  XL) 300 MG 24 hr tablet TAKE 1 TABLET (300 MG TOTAL) BY MOUTH EVERY MORNING. 90 tablet 3   cetirizine (ZYRTEC) 10 MG tablet Take 10 mg by mouth as needed.      Famotidine (PEPCID PO) Take 1 tablet by mouth every morning.     fluticasone (FLONASE) 50 MCG/ACT nasal spray SMARTSIG:2 Spray(s) Both Nares Every 12 Hours     loratadine (CLARITIN) 10 MG tablet Take 10 mg by mouth daily.     norethindrone  (AYGESTIN ) 5 MG tablet Take 1 tablet (5 mg total) by mouth daily. 90 tablet 3   valsartan -hydrochlorothiazide  (DIOVAN -HCT) 80-12.5 MG tablet Take 1 tablet by mouth daily. 90 tablet 3   No current facility-administered medications on file prior to visit.      ROS:  Review of Systems  Constitutional:  Negative for fatigue, fever and unexpected weight  change.  Respiratory:  Negative for cough, shortness of breath and wheezing.   Cardiovascular:  Negative for chest pain, palpitations and leg swelling.  Gastrointestinal:  Negative for blood in stool, constipation, diarrhea, nausea and vomiting.  Endocrine: Negative for cold intolerance, heat intolerance and polyuria.  Genitourinary:  Negative for dyspareunia, dysuria, flank pain, frequency, genital sores, hematuria, menstrual problem, pelvic pain, urgency, vaginal bleeding, vaginal discharge and vaginal pain.  Musculoskeletal:  Negative for arthralgias, back pain, joint swelling and myalgias.  Skin:  Negative for rash.  Neurological:  Negative  for dizziness, syncope, light-headedness, numbness and headaches.  Hematological:  Negative for adenopathy.  Psychiatric/Behavioral:  Negative for agitation, confusion, dysphoric mood, sleep disturbance and suicidal ideas. The patient is not nervous/anxious.      Objective: There were no vitals taken for this visit.   Physical Exam Constitutional:      Appearance: She is well-developed.  Genitourinary:     Vulva normal.     Right Labia: No rash, tenderness or lesions.    Left Labia: No tenderness, lesions or rash.    No vaginal discharge, erythema or tenderness.      Right Adnexa: not tender and no mass present.    Left Adnexa: not tender and no mass present.    No cervical motion tenderness, friability or polyp.     Uterus is not enlarged or tender.  Breasts:    Right: No mass, nipple discharge, skin change or tenderness.     Left: No mass, nipple discharge, skin change or tenderness.  Neck:     Thyroid: No thyromegaly.  Cardiovascular:     Rate and Rhythm: Normal rate and regular rhythm.     Heart sounds: Normal heart sounds. No murmur heard. Pulmonary:     Effort: Pulmonary effort is normal.     Breath sounds: Normal breath sounds.  Abdominal:     Palpations: Abdomen is soft.     Tenderness: There is no abdominal tenderness.  There is no guarding or rebound.  Musculoskeletal:        General: Normal range of motion.     Cervical back: Normal range of motion.  Lymphadenopathy:     Cervical: No cervical adenopathy.  Neurological:     General: No focal deficit present.     Mental Status: She is alert and oriented to person, place, and time.     Cranial Nerves: No cranial nerve deficit.  Skin:    General: Skin is warm and dry.  Psychiatric:        Mood and Affect: Mood normal.        Behavior: Behavior normal.        Thought Content: Thought content normal.        Judgment: Judgment normal.  Vitals reviewed.     Assessment/Plan: No diagnosis found.   No orders of the defined types were placed in this encounter.        GYN counsel breast self exam, mammography screening, adequate intake of calcium and vitamin D, diet and exercise     F/U  No follow-ups on file.  Joycelyn Liska B. Ocean Schildt, PA-C 04/17/2024 1:40 PM

## 2024-04-18 ENCOUNTER — Other Ambulatory Visit (HOSPITAL_COMMUNITY)
Admission: RE | Admit: 2024-04-18 | Discharge: 2024-04-18 | Disposition: A | Discharge status: Home / Self Care | Source: Ambulatory Visit | Attending: Obstetrics and Gynecology | Admitting: Obstetrics and Gynecology

## 2024-04-18 ENCOUNTER — Ambulatory Visit: Payer: Self-pay | Admitting: Obstetrics and Gynecology

## 2024-04-18 ENCOUNTER — Encounter: Payer: Self-pay | Admitting: Obstetrics and Gynecology

## 2024-04-18 VITALS — BP 118/72 | HR 86 | Ht 67.0 in | Wt 158.0 lb

## 2024-04-18 DIAGNOSIS — Z1231 Encounter for screening mammogram for malignant neoplasm of breast: Secondary | ICD-10-CM

## 2024-04-18 DIAGNOSIS — Z124 Encounter for screening for malignant neoplasm of cervix: Secondary | ICD-10-CM

## 2024-04-18 DIAGNOSIS — Z1151 Encounter for screening for human papillomavirus (HPV): Secondary | ICD-10-CM | POA: Diagnosis not present

## 2024-04-18 DIAGNOSIS — Z01419 Encounter for gynecological examination (general) (routine) without abnormal findings: Secondary | ICD-10-CM

## 2024-04-18 DIAGNOSIS — Z3041 Encounter for surveillance of contraceptive pills: Secondary | ICD-10-CM

## 2024-04-18 MED ORDER — NORETHINDRONE ACETATE 5 MG PO TABS: 5.0000 mg | ORAL_TABLET | Freq: Every day | ORAL | 3 refills | Status: AC

## 2024-04-18 NOTE — Patient Instructions (Signed)
 I value your feedback and you entrusting [place] with your care. If you get a [place] patient survey, I would appreciate you taking the time to let us know about your experience today. Thank you! ? ? ?

## 2024-04-20 LAB — COMPREHENSIVE METABOLIC PANEL WITH GFR
ALT: 35 IU/L — ABNORMAL HIGH (ref 0–32)
AST: 28 IU/L (ref 0–40)
Albumin: 4.6 g/dL (ref 3.9–4.9)
Alkaline Phosphatase: 49 IU/L (ref 41–116)
BUN/Creatinine Ratio: 12 (ref 9–23)
BUN: 14 mg/dL (ref 6–24)
Bilirubin Total: 0.7 mg/dL (ref 0.0–1.2)
CO2: 21 mmol/L (ref 20–29)
Calcium: 9.4 mg/dL (ref 8.7–10.2)
Chloride: 101 mmol/L (ref 96–106)
Creatinine, Ser: 1.17 mg/dL — ABNORMAL HIGH (ref 0.57–1.00)
Globulin, Total: 2.7 g/dL (ref 1.5–4.5)
Glucose: 71 mg/dL (ref 70–99)
Potassium: 4 mmol/L (ref 3.5–5.2)
Sodium: 138 mmol/L (ref 134–144)
Total Protein: 7.3 g/dL (ref 6.0–8.5)
eGFR: 57 mL/min/1.73 — ABNORMAL LOW (ref >59)

## 2024-04-20 LAB — CBC
Hematocrit: 42.5 % (ref 34.0–46.6)
Hemoglobin: 13.7 g/dL (ref 11.1–15.9)
MCH: 31.6 pg (ref 26.6–33.0)
MCHC: 32.2 g/dL (ref 31.5–35.7)
MCV: 98 fL — ABNORMAL HIGH (ref 79–97)
Platelets: 331 x10E3/uL (ref 150–450)
RBC: 4.34 x10E6/uL (ref 3.77–5.28)
RDW: 11.6 % — ABNORMAL LOW (ref 11.7–15.4)
WBC: 6.5 x10E3/uL (ref 3.4–10.8)

## 2024-04-20 LAB — CYTOLOGY - PAP
Comment: NEGATIVE
Diagnosis: NEGATIVE
High risk HPV: NEGATIVE

## 2024-04-20 LAB — LIPOPROTEIN A (LPA): Lipoprotein (a): <8.4 nmol/L (ref <75.0)

## 2024-04-20 LAB — LIPID PANEL
Chol/HDL Ratio: 5.2 ratio — ABNORMAL HIGH (ref 0.0–4.4)
Cholesterol, Total: 176 mg/dL (ref 100–199)
HDL: 34 mg/dL — ABNORMAL LOW (ref >39)
LDL Chol Calc (NIH): 130 mg/dL — ABNORMAL HIGH (ref 0–99)
Triglycerides: 60 mg/dL (ref 0–149)
VLDL Cholesterol Cal: 12 mg/dL (ref 5–40)

## 2024-04-20 LAB — APOLIPOPROTEIN B: Apolipoprotein B: 101 mg/dL — ABNORMAL HIGH (ref <90)

## 2024-04-22 ENCOUNTER — Ambulatory Visit: Payer: Self-pay | Admitting: Family Medicine

## 2024-04-22 DIAGNOSIS — E782 Mixed hyperlipidemia: Secondary | ICD-10-CM | POA: Insufficient documentation

## 2024-04-22 MED ORDER — ROSUVASTATIN CALCIUM 5 MG PO TABS: 5.0000 mg | ORAL_TABLET | Freq: Every day | ORAL | 3 refills | Status: AC

## 2024-05-04 ENCOUNTER — Encounter: Admitting: Family Medicine

## 2024-06-13 ENCOUNTER — Other Ambulatory Visit: Payer: Self-pay | Admitting: Obstetrics and Gynecology

## 2024-06-13 ENCOUNTER — Encounter: Payer: Self-pay | Admitting: Obstetrics and Gynecology

## 2024-06-13 DIAGNOSIS — Z3041 Encounter for surveillance of contraceptive pills: Secondary | ICD-10-CM

## 2024-06-13 NOTE — Telephone Encounter (Signed)
 Called pharmacy and spoke to Jaylin. Rx is in their system, will be ready for pick up tomorrow. I asked the tech if they didn't have it in stock that it wasn't available for patient. Tech said no, that she doesn't know when and if patient requested it. Patient aware. Also advised patient to request at pharmacy for old Rx's to be deleted. Patient stated she had requested RF through pharmacy app.

## 2024-06-20 ENCOUNTER — Ambulatory Visit: Admitting: Family Medicine

## 2024-06-20 VITALS — BP 120/79 | HR 88 | Ht 67.0 in | Wt 162.0 lb

## 2024-06-20 DIAGNOSIS — E782 Mixed hyperlipidemia: Secondary | ICD-10-CM | POA: Diagnosis not present

## 2024-06-20 DIAGNOSIS — Z23 Encounter for immunization: Secondary | ICD-10-CM

## 2024-06-20 DIAGNOSIS — Z8249 Family history of ischemic heart disease and other diseases of the circulatory system: Secondary | ICD-10-CM | POA: Diagnosis not present

## 2024-06-20 DIAGNOSIS — Z1159 Encounter for screening for other viral diseases: Secondary | ICD-10-CM

## 2024-06-20 NOTE — Progress Notes (Signed)
 "     Established patient visit   Patient: Lori Dawson   DOB: 08/05/1975   49 y.o. Female  MRN: [id number] Visit Date: 06/20/2024  Today's healthcare provider: Nancyann Perry, MD   Chief Complaint  Patient presents with   Hyperlipidemia    Patient was started on Rosuvastatin  on her last visit.  She denies any side effects or problems with the new medication.  She is fasting other than a handful of almonds.   Subjective    Discussed the use of AI scribe software for clinical note transcription with the patient, who gave verbal consent to proceed.  History of Present Illness   Lori Dawson is a 49 year old female who presents for follow-up regarding her cholesterol management.  She is currently on a 5 mg dose of cholesterol medication, taken every morning without any side effects. She has been consistent with her medication regimen and has not yet run out, as her prescription has a two to three month renewal period.  Her previous cholesterol levels indicated low HDL and higher than desired LDL. Additionally, her ApoB levels were elevated.  Her brother died at the age of 2 from a heart attack, despite having no known heart problems, which increases her concern about her cholesterol levels and cardiovascular health.  She includes a handful of almonds, approximately seven or eight, in her diet every morning, which she believes is beneficial for her health.     Lab Results  Component Value Date   CHOL 176 04/18/2024   HDL 34 (L) 04/18/2024   LDLCALC 130 (H) 04/18/2024   TRIG 60 04/18/2024   CHOLHDL 5.2 (H) 04/18/2024     Medications: Show/hide medication list[1] Review of Systems     Objective    BP 120/79 (BP Location: Left Arm, Patient Position: Sitting, Cuff Size: Normal)   Pulse 88   Ht 5' 7 (1.702 m)   Wt 162 lb (73.5 kg)   BMI 25.37 kg/m   Physical Exam   General appearance: Well developed, well nourished female, cooperative and in no acute  distress Head: Normocephalic, without obvious abnormality, atraumatic Respiratory: Respirations even and unlabored, normal respiratory rate Extremities: All extremities are intact.  Skin: Skin color, texture, turgor normal. No rashes seen  Psych: Appropriate mood and affect. Neurologic: Mental status: Alert, oriented to person, place, and time, thought content appropriate.    Assessment & Plan    1. Mixed hyperlipidemia (Primary) Doing well with initiation of rosuvastatin . Anticipate increasing to 10mg  to get LDL at least below 100, ideally below 70.   - Comprehensive metabolic panel with GFR - Lipid panel - Apolipoprotein B  2. Family history of myocardial infarction in first degree female relative       Nancyann Perry, MD  [facility] [phone] (phone) [phone] (fax)  [facility]    [1]  Outpatient Medications Prior to Visit  Medication Sig   buPROPion  (WELLBUTRIN  XL) 150 MG 24 hr tablet TAKE 1 TABLET BY MOUTH EVERY EVENING.   buPROPion  (WELLBUTRIN  XL) 300 MG 24 hr tablet TAKE 1 TABLET (300 MG TOTAL) BY MOUTH EVERY MORNING.   cetirizine (ZYRTEC) 10 MG tablet Take 10 mg by mouth as needed.    Famotidine (PEPCID PO) Take 1 tablet by mouth every morning.   fluticasone (FLONASE) 50 MCG/ACT nasal spray SMARTSIG:2 Spray(s) Both Nares Every 12 Hours   loratadine (CLARITIN) 10 MG tablet Take 10 mg by mouth daily.   norethindrone  (AYGESTIN ) 5  MG tablet Take 1 tablet (5 mg total) by mouth daily.   rosuvastatin  (CRESTOR ) 5 MG tablet Take 1 tablet (5 mg total) by mouth daily.   valsartan -hydrochlorothiazide  (DIOVAN -HCT) 80-12.5 MG tablet Take 1 tablet by mouth daily.   No facility-administered medications prior to visit.   "

## 2024-06-21 ENCOUNTER — Ambulatory Visit: Payer: Self-pay | Admitting: Family Medicine

## 2024-06-21 LAB — COMPREHENSIVE METABOLIC PANEL WITH GFR
ALT: 41 [IU]/L — ABNORMAL HIGH (ref 0–32)
AST: 31 [IU]/L (ref 0–40)
Albumin: 4.6 g/dL (ref 3.9–4.9)
Alkaline Phosphatase: 49 [IU]/L (ref 41–116)
BUN/Creatinine Ratio: 14 (ref 9–23)
BUN: 13 mg/dL (ref 6–24)
Bilirubin Total: 0.7 mg/dL (ref 0.0–1.2)
CO2: 21 mmol/L (ref 20–29)
Calcium: 9.1 mg/dL (ref 8.7–10.2)
Chloride: 102 mmol/L (ref 96–106)
Creatinine, Ser: 0.9 mg/dL (ref 0.57–1.00)
Globulin, Total: 2.6 g/dL (ref 1.5–4.5)
Glucose: 70 mg/dL (ref 70–99)
Potassium: 4 mmol/L (ref 3.5–5.2)
Sodium: 139 mmol/L (ref 134–144)
Total Protein: 7.2 g/dL (ref 6.0–8.5)
eGFR: 78 mL/min/{1.73_m2}

## 2024-06-21 LAB — LIPID PANEL
Chol/HDL Ratio: 3.3 ratio (ref 0.0–4.4)
Cholesterol, Total: 118 mg/dL (ref 100–199)
HDL: 36 mg/dL — ABNORMAL LOW
LDL Chol Calc (NIH): 68 mg/dL (ref 0–99)
Triglycerides: 64 mg/dL (ref 0–149)
VLDL Cholesterol Cal: 14 mg/dL (ref 5–40)

## 2024-06-21 LAB — APOLIPOPROTEIN B: Apolipoprotein B: 67 mg/dL

## 2024-10-31 ENCOUNTER — Encounter
